# Patient Record
Sex: Male | Born: 1965 | Race: White | Hispanic: No | Marital: Married | Smoking: Former smoker
Health system: Southern US, Community
[De-identification: ages and names within clinical notes are randomized; demographics above are authoritative.]

## PROBLEM LIST (undated history)

## (undated) DIAGNOSIS — G4730 Sleep apnea, unspecified: Secondary | ICD-10-CM

## (undated) DIAGNOSIS — N200 Calculus of kidney: Secondary | ICD-10-CM

## (undated) HISTORY — PX: VASECTOMY: SHX75

## (undated) HISTORY — PX: TONSILLECTOMY: SUR1361

## (undated) HISTORY — DX: Sleep apnea, unspecified: G47.30

---

## 2005-04-22 ENCOUNTER — Encounter (INDEPENDENT_AMBULATORY_CARE_PROVIDER_SITE_OTHER): Payer: Self-pay | Admitting: Internal Medicine

## 2005-04-22 LAB — CONVERTED CEMR LAB: PSA: NEGATIVE ng/mL

## 2005-09-14 ENCOUNTER — Ambulatory Visit: Payer: Self-pay | Admitting: Orthopedic Surgery

## 2005-10-08 ENCOUNTER — Ambulatory Visit: Payer: Self-pay | Admitting: Orthopedic Surgery

## 2006-03-29 ENCOUNTER — Ambulatory Visit: Payer: Self-pay | Admitting: Internal Medicine

## 2006-03-29 LAB — CONVERTED CEMR LAB
AST: 21 units/L
Alkaline Phosphatase: 66 units/L
Basophils Relative: 0 %
CO2: 23 meq/L
Chloride: 103 meq/L
Creatinine, Ser: 0.97 mg/dL
Eosinophils Relative: 1 %
Glucose, Bld: 94 mg/dL
Hemoglobin: 15.2 g/dL
Lymphocytes Relative: 42 %
MCHC: 34.5 g/dL
Monocytes Relative: 6 %
Neutrophils Relative %: 51 %
Platelets: 216 10*3/uL
TSH: 2.285 microintl units/mL
Total Bilirubin: 0.4 mg/dL
Total Protein: 7.6 g/dL
WBC: 8.9 10*3/uL

## 2006-03-30 ENCOUNTER — Encounter (INDEPENDENT_AMBULATORY_CARE_PROVIDER_SITE_OTHER): Payer: Self-pay | Admitting: Internal Medicine

## 2006-03-30 LAB — CONVERTED CEMR LAB
RBC count: 5.02 10*6/uL
WBC, blood: 8.9 10*3/uL

## 2006-04-16 ENCOUNTER — Ambulatory Visit: Admission: RE | Admit: 2006-04-16 | Discharge: 2006-04-16 | Payer: Self-pay | Admitting: Internal Medicine

## 2006-04-21 ENCOUNTER — Ambulatory Visit: Payer: Self-pay | Admitting: Pulmonary Disease

## 2006-05-11 ENCOUNTER — Encounter: Payer: Self-pay | Admitting: Internal Medicine

## 2006-05-11 ENCOUNTER — Encounter (INDEPENDENT_AMBULATORY_CARE_PROVIDER_SITE_OTHER): Payer: Self-pay | Admitting: Internal Medicine

## 2006-05-11 DIAGNOSIS — E291 Testicular hypofunction: Secondary | ICD-10-CM

## 2006-05-11 DIAGNOSIS — G4733 Obstructive sleep apnea (adult) (pediatric): Secondary | ICD-10-CM | POA: Insufficient documentation

## 2006-05-11 DIAGNOSIS — F528 Other sexual dysfunction not due to a substance or known physiological condition: Secondary | ICD-10-CM | POA: Insufficient documentation

## 2006-05-11 DIAGNOSIS — I10 Essential (primary) hypertension: Secondary | ICD-10-CM | POA: Insufficient documentation

## 2006-05-11 HISTORY — DX: Testicular hypofunction: E29.1

## 2006-05-11 HISTORY — DX: Other sexual dysfunction not due to a substance or known physiological condition: F52.8

## 2006-06-21 ENCOUNTER — Ambulatory Visit: Payer: Self-pay | Admitting: Internal Medicine

## 2006-06-21 ENCOUNTER — Ambulatory Visit (HOSPITAL_COMMUNITY): Admission: RE | Admit: 2006-06-21 | Discharge: 2006-06-21 | Payer: Self-pay | Admitting: Internal Medicine

## 2006-07-06 ENCOUNTER — Ambulatory Visit: Payer: Self-pay | Admitting: Internal Medicine

## 2006-12-28 ENCOUNTER — Encounter (INDEPENDENT_AMBULATORY_CARE_PROVIDER_SITE_OTHER): Payer: Self-pay | Admitting: Internal Medicine

## 2006-12-31 ENCOUNTER — Telehealth (INDEPENDENT_AMBULATORY_CARE_PROVIDER_SITE_OTHER): Payer: Self-pay | Admitting: *Deleted

## 2006-12-31 ENCOUNTER — Ambulatory Visit: Payer: Self-pay | Admitting: Internal Medicine

## 2006-12-31 DIAGNOSIS — R5381 Other malaise: Secondary | ICD-10-CM

## 2006-12-31 DIAGNOSIS — R5383 Other fatigue: Secondary | ICD-10-CM

## 2006-12-31 DIAGNOSIS — E785 Hyperlipidemia, unspecified: Secondary | ICD-10-CM | POA: Insufficient documentation

## 2006-12-31 HISTORY — DX: Other malaise: R53.81

## 2007-01-03 ENCOUNTER — Telehealth (INDEPENDENT_AMBULATORY_CARE_PROVIDER_SITE_OTHER): Payer: Self-pay | Admitting: Internal Medicine

## 2007-01-03 ENCOUNTER — Telehealth (INDEPENDENT_AMBULATORY_CARE_PROVIDER_SITE_OTHER): Payer: Self-pay | Admitting: *Deleted

## 2007-01-03 LAB — CONVERTED CEMR LAB
HDL: 28 mg/dL — ABNORMAL LOW (ref >39)
LDL Cholesterol: 105 mg/dL — ABNORMAL HIGH (ref 0–99)
Testosterone: 331.83 ng/dL — ABNORMAL LOW (ref 350–890)
Triglycerides: 240 mg/dL — ABNORMAL HIGH (ref <150)

## 2007-01-10 ENCOUNTER — Ambulatory Visit: Payer: Self-pay | Admitting: Internal Medicine

## 2007-01-20 ENCOUNTER — Encounter (INDEPENDENT_AMBULATORY_CARE_PROVIDER_SITE_OTHER): Payer: Self-pay | Admitting: Internal Medicine

## 2007-02-08 ENCOUNTER — Encounter (INDEPENDENT_AMBULATORY_CARE_PROVIDER_SITE_OTHER): Payer: Self-pay | Admitting: Internal Medicine

## 2007-03-03 ENCOUNTER — Encounter (INDEPENDENT_AMBULATORY_CARE_PROVIDER_SITE_OTHER): Payer: Self-pay | Admitting: Internal Medicine

## 2007-04-06 ENCOUNTER — Telehealth (INDEPENDENT_AMBULATORY_CARE_PROVIDER_SITE_OTHER): Payer: Self-pay | Admitting: Internal Medicine

## 2007-04-06 ENCOUNTER — Encounter (INDEPENDENT_AMBULATORY_CARE_PROVIDER_SITE_OTHER): Payer: Self-pay | Admitting: Internal Medicine

## 2007-11-29 ENCOUNTER — Ambulatory Visit: Payer: Self-pay | Admitting: Internal Medicine

## 2007-11-29 DIAGNOSIS — M79609 Pain in unspecified limb: Secondary | ICD-10-CM

## 2007-11-29 HISTORY — DX: Pain in unspecified limb: M79.609

## 2007-12-01 LAB — CONVERTED CEMR LAB
AST: 22 units/L (ref 0–37)
Basophils Relative: 0 % (ref 0–1)
CO2: 23 meq/L (ref 19–32)
Chloride: 106 meq/L (ref 96–112)
Cholesterol: 169 mg/dL (ref 0–200)
Glucose, Bld: 84 mg/dL (ref 70–99)
HCT: 46.9 % (ref 39.0–52.0)
HDL: 26 mg/dL — ABNORMAL LOW (ref >39)
Lymphs Abs: 3.6 10*3/uL (ref 0.7–4.0)
MCHC: 33.7 g/dL (ref 30.0–36.0)
Platelets: 221 10*3/uL (ref 150–400)
RDW: 13.2 % (ref 11.5–15.5)
Sodium: 139 meq/L (ref 135–145)
Triglycerides: 285 mg/dL — ABNORMAL HIGH (ref <150)
Uric Acid, Serum: 7.7 mg/dL (ref 4.0–7.8)
VLDL: 57 mg/dL — ABNORMAL HIGH (ref 0–40)

## 2007-12-29 ENCOUNTER — Ambulatory Visit: Payer: Self-pay | Admitting: Internal Medicine

## 2008-01-26 ENCOUNTER — Ambulatory Visit: Payer: Self-pay | Admitting: Internal Medicine

## 2008-02-29 ENCOUNTER — Ambulatory Visit: Payer: Self-pay | Admitting: Internal Medicine

## 2008-03-05 ENCOUNTER — Encounter (INDEPENDENT_AMBULATORY_CARE_PROVIDER_SITE_OTHER): Payer: Self-pay | Admitting: Internal Medicine

## 2008-03-05 LAB — CONVERTED CEMR LAB
Alkaline Phosphatase: 61 units/L (ref 39–117)
Basophils Relative: 0 % (ref 0–1)
Eosinophils Absolute: 0.1 10*3/uL (ref 0.0–0.7)
Eosinophils Relative: 1 % (ref 0–5)
HCT: 46 % (ref 39.0–52.0)
Lymphocytes Relative: 51 % — ABNORMAL HIGH (ref 12–46)
Lymphs Abs: 4.7 10*3/uL — ABNORMAL HIGH (ref 0.7–4.0)
Monocytes Absolute: 0.7 10*3/uL (ref 0.1–1.0)
Monocytes Relative: 7 % (ref 3–12)
Platelets: 213 10*3/uL (ref 150–400)
RBC: 5.19 M/uL (ref 4.22–5.81)

## 2008-04-06 ENCOUNTER — Ambulatory Visit: Payer: Self-pay | Admitting: Internal Medicine

## 2008-05-04 ENCOUNTER — Ambulatory Visit: Payer: Self-pay | Admitting: Internal Medicine

## 2008-05-31 ENCOUNTER — Ambulatory Visit: Payer: Self-pay | Admitting: Internal Medicine

## 2008-06-01 ENCOUNTER — Encounter (INDEPENDENT_AMBULATORY_CARE_PROVIDER_SITE_OTHER): Payer: Self-pay | Admitting: Internal Medicine

## 2008-06-04 LAB — CONVERTED CEMR LAB
AST: 22 units/L (ref 0–37)
Alkaline Phosphatase: 59 units/L (ref 39–117)
BUN: 11 mg/dL (ref 6–23)
Basophils Absolute: 0 10*3/uL (ref 0.0–0.1)
CO2: 23 meq/L (ref 19–32)
Chloride: 103 meq/L (ref 96–112)
Eosinophils Relative: 1 % (ref 0–5)
Lymphs Abs: 3 10*3/uL (ref 0.7–4.0)
MCV: 89 fL (ref 78.0–100.0)
Monocytes Absolute: 0.4 10*3/uL (ref 0.1–1.0)
Monocytes Relative: 6 % (ref 3–12)
Neutro Abs: 3.4 10*3/uL (ref 1.7–7.7)
Neutrophils Relative %: 50 % (ref 43–77)
PSA: 1.69 ng/mL (ref 0.10–4.00)
Platelets: 233 10*3/uL (ref 150–400)
Sodium: 138 meq/L (ref 135–145)

## 2008-07-02 ENCOUNTER — Ambulatory Visit: Payer: Self-pay | Admitting: Internal Medicine

## 2008-07-30 ENCOUNTER — Ambulatory Visit: Payer: Self-pay | Admitting: Internal Medicine

## 2008-08-27 ENCOUNTER — Ambulatory Visit: Payer: Self-pay | Admitting: Internal Medicine

## 2008-08-28 ENCOUNTER — Encounter (INDEPENDENT_AMBULATORY_CARE_PROVIDER_SITE_OTHER): Payer: Self-pay | Admitting: Internal Medicine

## 2008-08-29 LAB — CONVERTED CEMR LAB
ALT: 49 units/L (ref 0–53)
Alkaline Phosphatase: 54 units/L (ref 39–117)
Basophils Absolute: 0 10*3/uL (ref 0.0–0.1)
Basophils Relative: 0 % (ref 0–1)
Eosinophils Absolute: 0.1 10*3/uL (ref 0.0–0.7)
Lymphs Abs: 4 10*3/uL (ref 0.7–4.0)
MCV: 87.5 fL (ref 78.0–100.0)
Monocytes Relative: 8 % (ref 3–12)
Neutro Abs: 4.7 10*3/uL (ref 1.7–7.7)
PSA: 0.92 ng/mL (ref 0.10–4.00)
RBC: 5.38 M/uL (ref 4.22–5.81)
RDW: 13.7 % (ref 11.5–15.5)
Total Bilirubin: 0.5 mg/dL (ref 0.3–1.2)
Total Protein: 7.6 g/dL (ref 6.0–8.3)
WBC: 9.5 10*3/uL (ref 4.0–10.5)

## 2008-09-21 ENCOUNTER — Ambulatory Visit: Payer: Self-pay | Admitting: Family Medicine

## 2008-10-22 ENCOUNTER — Ambulatory Visit: Payer: Self-pay | Admitting: Internal Medicine

## 2008-11-16 ENCOUNTER — Ambulatory Visit: Payer: Self-pay | Admitting: Internal Medicine

## 2008-12-14 ENCOUNTER — Ambulatory Visit: Payer: Self-pay | Admitting: Internal Medicine

## 2008-12-14 DIAGNOSIS — R55 Syncope and collapse: Secondary | ICD-10-CM

## 2008-12-14 HISTORY — DX: Syncope and collapse: R55

## 2008-12-18 LAB — CONVERTED CEMR LAB
ALT: 34 units/L (ref 0–53)
AST: 25 units/L (ref 0–37)
Eosinophils Absolute: 0.1 10*3/uL (ref 0.0–0.7)
Eosinophils Relative: 2 % (ref 0–5)
HCT: 47.9 % (ref 39.0–52.0)
Lymphocytes Relative: 42 % (ref 12–46)
Lymphs Abs: 2.8 10*3/uL (ref 0.7–4.0)
MCV: 86.6 fL (ref 78.0–100.0)
Monocytes Absolute: 0.5 10*3/uL (ref 0.1–1.0)
Monocytes Relative: 8 % (ref 3–12)
Neutrophils Relative %: 48 % (ref 43–77)
Platelets: 232 10*3/uL (ref 150–400)
Testosterone: 300.56 ng/dL — ABNORMAL LOW (ref 350–890)
Total Protein: 7.8 g/dL (ref 6.0–8.3)

## 2009-01-11 ENCOUNTER — Ambulatory Visit: Payer: Self-pay | Admitting: Internal Medicine

## 2009-02-08 ENCOUNTER — Ambulatory Visit: Payer: Self-pay | Admitting: Internal Medicine

## 2009-03-08 ENCOUNTER — Ambulatory Visit: Payer: Self-pay | Admitting: Internal Medicine

## 2009-03-11 LAB — CONVERTED CEMR LAB
Albumin: 4.8 g/dL (ref 3.5–5.2)
Basophils Absolute: 0 10*3/uL (ref 0.0–0.1)
Basophils Relative: 0 % (ref 0–1)
Calcium: 10.1 mg/dL (ref 8.4–10.5)
Chloride: 102 meq/L (ref 96–112)
Eosinophils Absolute: 0 10*3/uL (ref 0.0–0.7)
Eosinophils Relative: 0 % (ref 0–5)
Neutrophils Relative %: 59 % (ref 43–77)
Potassium: 4.2 meq/L (ref 3.5–5.3)
RBC: 5.4 M/uL (ref 4.22–5.81)
Testosterone: 234.58 ng/dL — ABNORMAL LOW (ref 350–890)
Total Protein: 8.2 g/dL (ref 6.0–8.3)
WBC: 8.9 10*3/uL (ref 4.0–10.5)

## 2009-03-22 ENCOUNTER — Encounter (INDEPENDENT_AMBULATORY_CARE_PROVIDER_SITE_OTHER): Payer: Self-pay | Admitting: Internal Medicine

## 2009-09-09 ENCOUNTER — Ambulatory Visit (HOSPITAL_COMMUNITY): Admission: RE | Admit: 2009-09-09 | Discharge: 2009-09-09 | Payer: Self-pay | Admitting: Pulmonary Disease

## 2010-11-07 NOTE — Procedures (Signed)
NAME:  Charles Holloway, Charles Holloway NO.:  [id number]   MEDICAL RECORD NO.:  [id number]          PATIENT TYPE:  OUT   LOCATION:  SLEEP LAB                     FACILITY:  [facility]   PHYSICIAN:  Barbaraann Share, MD,FCCPDATE OF BIRTH:  1965-08-09   DATE OF STUDY:  04/16/2006                              NOCTURNAL POLYSOMNOGRAM   REFERRING PHYSICIAN:  Dr. Erle Crocker.   INDICATIONS FOR THE STUDY:  Hypersomnia with sleep apnea.   EPWORTH SCORE:  Is 12.   SLEEP ARCHITECTURE:  The patient had total sleep time of 337 minutes with  adequate slow wave sleep and borderline quantity of REM.  Sleep onset  latency was prolonged at 46 minutes and REM onset was normal.  Sleep  efficiency was decreased at 85%.   RESPIRATORY DATA:  The patient underwent split-night study where he was  found to have 29 obstructive events in the first 135 minutes of sleep.  This  gave him a respiratory disturbance index of 13 events per hour over the  first half of the night.  Events were not positional, but there was very  loud snoring noted throughout.  By protocol, the patient was then placed on  a small/medium ComfortLite Respironics Nasal Mask and ultimately titrated to  a final pressure of 7 cm of water for control of both obstructive events and  snoring.  There was excellent control of the patient's events, even through  REM.   OXYGEN DATA:  There was O2 desaturation as low as 79% with the patient's  obstructive events.   CARDIAC DATA:  No clinically significant cardiac arrhythmias.   MOVEMENT/PARASOMNIA:  None.   IMPRESSION/RECOMMENDATIONS:  Split-night study reveals mild obstructive  sleep apnea/hypopnea syndrome with a respiratory disturbance index of 13  events per hour and O2 desaturation as low as 79%.  The patient was then  placed on CPAP and ultimately titrated to 7 cm of pressure with a  small/medium ComfortLite Respironics Nasal Mask.  Other treatment  considerations for mild obstructive  sleep apnea include weight loss alone if  applicable, upper airway surgery, and oral appliance.  Because the patient  had a split-night study, it is really  unclear whether his severity would have stayed in the mild range throughout  the whole night if CPAP was not added.  Clinical correlation is suggested.                                            ______________________________  Barbaraann Share, MD,FCCP  Diplomate, American Board of Sleep  Medicine     [facility]/MEDQ  D:  04/22/2006 16:44:30  T:  04/23/2006 08:30:04  Job:  [id number]

## 2012-09-19 ENCOUNTER — Other Ambulatory Visit (HOSPITAL_COMMUNITY): Payer: Self-pay | Admitting: Pulmonary Disease

## 2012-09-19 ENCOUNTER — Ambulatory Visit (HOSPITAL_COMMUNITY)
Admission: RE | Admit: 2012-09-19 | Discharge: 2012-09-19 | Disposition: A | Discharge status: Home / Self Care | Payer: 59 | Source: Ambulatory Visit | Attending: Pulmonary Disease | Admitting: Pulmonary Disease

## 2012-09-19 DIAGNOSIS — X58XXXA Exposure to other specified factors, initial encounter: Secondary | ICD-10-CM | POA: Insufficient documentation

## 2012-09-19 DIAGNOSIS — M25529 Pain in unspecified elbow: Secondary | ICD-10-CM | POA: Insufficient documentation

## 2012-09-19 DIAGNOSIS — IMO0002 Reserved for concepts with insufficient information to code with codable children: Secondary | ICD-10-CM | POA: Insufficient documentation

## 2012-09-19 DIAGNOSIS — S2239XA Fracture of one rib, unspecified side, initial encounter for closed fracture: Secondary | ICD-10-CM | POA: Insufficient documentation

## 2012-09-19 DIAGNOSIS — R05 Cough: Secondary | ICD-10-CM | POA: Insufficient documentation

## 2012-09-19 DIAGNOSIS — R059 Cough, unspecified: Secondary | ICD-10-CM | POA: Insufficient documentation

## 2012-09-19 DIAGNOSIS — R0781 Pleurodynia: Secondary | ICD-10-CM

## 2012-09-19 DIAGNOSIS — R079 Chest pain, unspecified: Secondary | ICD-10-CM | POA: Insufficient documentation

## 2012-09-19 DIAGNOSIS — W19XXXA Unspecified fall, initial encounter: Secondary | ICD-10-CM | POA: Insufficient documentation

## 2013-10-12 ENCOUNTER — Other Ambulatory Visit (HOSPITAL_COMMUNITY): Payer: Self-pay

## 2013-10-12 DIAGNOSIS — G4730 Sleep apnea, unspecified: Secondary | ICD-10-CM

## 2013-10-27 ENCOUNTER — Ambulatory Visit: Payer: 59 | Attending: Pulmonary Disease | Admitting: Sleep Medicine

## 2013-10-27 DIAGNOSIS — G4730 Sleep apnea, unspecified: Secondary | ICD-10-CM

## 2013-10-27 DIAGNOSIS — G4733 Obstructive sleep apnea (adult) (pediatric): Secondary | ICD-10-CM | POA: Insufficient documentation

## 2013-11-03 NOTE — Sleep Study (Signed)
  [facility] Aryka Coonradt A. Gerilyn Pilgrimoonquah, MD     [email or web address]        NOCTURNAL POLYSOMNOGRAM    LOCATION: SLEEP LAB FACILITY:    PHYSICIAN: Dreana Britz A. Gerilyn Pilgrimoonquah, M.D.   DATE OF STUDY: 10/27/2013.   REFERRING PHYSICIAN: Kari BaarsEdward Hawkins.  INDICATIONS: This is a 48 year old man who a previous study documenting obstructive sleep apnea syndrome. The a CPAP titration recording.  MEDICATIONS:  Prior to Admission medications   Not on File      EPWORTH SLEEPINESS SCALE: 12.   BMI: 32.   ARCHITECTURAL SUMMARY: Total recording time was 406 minutes. Sleep efficiency 95 %. Sleep latency 12 minutes. REM latency 80 minutes. Stage NI 3 %, N2 40 % and N3 29 % and REM sleep 27 %.    RESPIRATORY DATA:  Baseline oxygen saturation is 95 %. The lowest saturation is 88 %. The patient was placed on positive pressure starting at 5 and increased to 8. The optimal pressure is 7 with resolution of obstructive events and good tolerance.  LIMB MOVEMENT SUMMARY: PLM index 0.   ELECTROCARDIOGRAM SUMMARY: Average heart rate is 64 with no significant dysrhythmias observed.   IMPRESSION:  1. Obstructive sleep apnea syndrome which responds well to a CPAP of 7.  Thanks for this referral.  Katalena Malveaux A. Gerilyn Pilgrimoonquah, M.D. Diplomat, Biomedical engineerAmerican Board of Sleep Medicine.

## 2016-04-01 ENCOUNTER — Other Ambulatory Visit (HOSPITAL_COMMUNITY): Payer: Self-pay | Admitting: Pulmonary Disease

## 2016-04-01 ENCOUNTER — Ambulatory Visit (HOSPITAL_COMMUNITY)
Admission: RE | Admit: 2016-04-01 | Discharge: 2016-04-01 | Disposition: A | Discharge status: Home / Self Care | Payer: 59 | Source: Ambulatory Visit | Attending: Pulmonary Disease | Admitting: Pulmonary Disease

## 2016-04-01 DIAGNOSIS — N200 Calculus of kidney: Secondary | ICD-10-CM | POA: Insufficient documentation

## 2016-04-01 DIAGNOSIS — M545 Low back pain: Secondary | ICD-10-CM

## 2016-04-01 DIAGNOSIS — N50819 Testicular pain, unspecified: Secondary | ICD-10-CM | POA: Diagnosis present

## 2016-04-01 DIAGNOSIS — M2578 Osteophyte, vertebrae: Secondary | ICD-10-CM | POA: Insufficient documentation

## 2016-06-26 ENCOUNTER — Encounter (HOSPITAL_COMMUNITY): Payer: Self-pay

## 2016-06-26 ENCOUNTER — Emergency Department (HOSPITAL_COMMUNITY): Payer: 59

## 2016-06-26 ENCOUNTER — Emergency Department (HOSPITAL_COMMUNITY)
Admission: EM | Admit: 2016-06-26 | Discharge: 2016-06-27 | Disposition: A | Discharge status: Home / Self Care | Payer: 59 | Attending: Emergency Medicine | Admitting: Emergency Medicine

## 2016-06-26 DIAGNOSIS — N200 Calculus of kidney: Secondary | ICD-10-CM | POA: Diagnosis not present

## 2016-06-26 DIAGNOSIS — I10 Essential (primary) hypertension: Secondary | ICD-10-CM | POA: Diagnosis not present

## 2016-06-26 DIAGNOSIS — R109 Unspecified abdominal pain: Secondary | ICD-10-CM | POA: Diagnosis not present

## 2016-06-26 DIAGNOSIS — N132 Hydronephrosis with renal and ureteral calculous obstruction: Secondary | ICD-10-CM | POA: Diagnosis not present

## 2016-06-26 HISTORY — DX: Calculus of kidney: N20.0

## 2016-06-26 LAB — BASIC METABOLIC PANEL
ANION GAP: 12 (ref 5–15)
BUN: 18 mg/dL (ref 6–20)
CO2: 25 mmol/L (ref 22–32)
Calcium: 9.7 mg/dL (ref 8.9–10.3)
Chloride: 100 mmol/L — ABNORMAL LOW (ref 101–111)
Creatinine, Ser: 1.3 mg/dL — ABNORMAL HIGH (ref 0.61–1.24)
GFR calc Af Amer: >60 mL/min (ref >60)
GLUCOSE: 135 mg/dL — AB (ref 65–99)
POTASSIUM: 3.5 mmol/L (ref 3.5–5.1)
SODIUM: 137 mmol/L (ref 135–145)

## 2016-06-26 LAB — CBC
HCT: 46.2 % (ref 39.0–52.0)
HEMOGLOBIN: 16.2 g/dL (ref 13.0–17.0)
MCH: 31.3 pg (ref 26.0–34.0)
MCHC: 35.1 g/dL (ref 30.0–36.0)
MCV: 89.2 fL (ref 78.0–100.0)
PLATELETS: 213 10*3/uL (ref 150–400)
RBC: 5.18 MIL/uL (ref 4.22–5.81)
RDW: 12.7 % (ref 11.5–15.5)
WBC: 15.1 10*3/uL — AB (ref 4.0–10.5)

## 2016-06-26 MED ORDER — ONDANSETRON HCL 4 MG/2ML IJ SOLN
4.0000 mg | Freq: Once | INTRAMUSCULAR | Status: AC
  Administered 2016-06-26: 4 mg via INTRAVENOUS
  Filled 2016-06-26: qty 2

## 2016-06-26 MED ORDER — FENTANYL CITRATE (PF) 100 MCG/2ML IJ SOLN
100.0000 ug | Freq: Once | INTRAMUSCULAR | Status: AC
  Administered 2016-06-26: 100 ug via INTRAVENOUS

## 2016-06-26 MED ORDER — HYDROMORPHONE HCL 1 MG/ML IJ SOLN
1.0000 mg | Freq: Once | INTRAMUSCULAR | Status: AC
  Administered 2016-06-26: 1 mg via INTRAVENOUS
  Filled 2016-06-26: qty 1

## 2016-06-26 MED ORDER — FENTANYL CITRATE (PF) 100 MCG/2ML IJ SOLN
50.0000 ug | INTRAMUSCULAR | Status: DC | PRN
  Filled 2016-06-26: qty 2

## 2016-06-26 NOTE — ED Triage Notes (Signed)
Patient states that he started having pain last night that seemed to be a kidney stone.  Then the pain went away so I thought it was in the bladder.  States that he started having the pain again tonight with pain going into left testicle and when we got home I started having pain again in left flank.  Have been vomiting and having nausea.

## 2016-06-26 NOTE — ED Provider Notes (Signed)
[facility]-EMERGENCY DEPT Provider Note   CSN: [id number] Arrival date & time: 06/26/16  2121     History   Chief Complaint Chief Complaint  Patient presents with  . Nephrolithiasis    HPI Charles Holloway is a 51 y.o. male.  HPI   Charles DessertWilliam D Holloway is a 51 y.o. male who presents to the Emergency Department complaining of sudden onset of left flank pain last evening.  He describes an intense pain that radiated toward his left groin and was associated with vomiting.  Pain resolved during the night and pain was improved today.  This evening, he states the pain returned and now radiates to the left testicle.  Pain is again associated with nausea and vomiting.  He reports hx of kidney stones and pain feels similar to previous.  He also endorses urinary hesitancy today.  He denies fever, hematuria, abdominal pain, LE's pain.  Has seen Dr. Vernie Ammonsttelin with [facility] in the past.    Past Medical History:  Diagnosis Date  . Kidney stones     Patient Active Problem List   Diagnosis Date Noted  . VASOVAGAL SYNCOPE 12/14/2008  . TOE PAIN 11/29/2007  . HYPERLIPIDEMIA 12/31/2006  . FATIGUE 12/31/2006  . HYPOGONADISM 05/11/2006  . ERECTILE DYSFUNCTION 05/11/2006  . OBSTRUCTIVE SLEEP APNEA 05/11/2006  . HYPERTENSION 05/11/2006    Past Surgical History:  Procedure Laterality Date  . TONSILLECTOMY    . VASECTOMY         Home Medications    Prior to Admission medications   Not on File    Family History No family history on file.  Social History Social History  Substance Use Topics  . Smoking status: Never Smoker  . Smokeless tobacco: Never Used  . Alcohol use Yes     Allergies   Penicillins   Review of Systems Review of Systems  Constitutional: Negative for appetite change, chills and fever.  Respiratory: Negative for shortness of breath.   Cardiovascular: Negative for chest pain.  Gastrointestinal: Positive for nausea and vomiting. Negative for abdominal pain and  blood in stool.  Genitourinary: Positive for difficulty urinating, flank pain and testicular pain. Negative for decreased urine volume, dysuria, hematuria, penile swelling and scrotal swelling.  Musculoskeletal: Negative for back pain.  Skin: Negative for color change and rash.  Neurological: Negative for dizziness, weakness and numbness.  Hematological: Negative for adenopathy.  All other systems reviewed and are negative.    Physical Exam Updated Vital Signs BP 156/90   Pulse 73   Temp 97.8 F (36.6 C) (Oral)   Resp 20   Ht 5\' 11"  (1.803 m)   Wt 99.8 kg   SpO2 96%   BMI 30.68 kg/m   Physical Exam  Constitutional: He is oriented to person, place, and time. He appears well-developed and well-nourished.  Pt is uncomfortable appearing  HENT:  Head: Normocephalic and atraumatic.  Mouth/Throat: Oropharynx is clear and moist.  Cardiovascular: Normal rate, regular rhythm and intact distal pulses.   No murmur heard. Pulmonary/Chest: Effort normal and breath sounds normal. No respiratory distress. He exhibits no tenderness.  Abdominal: Soft. Bowel sounds are normal. He exhibits no distension and no mass. There is no tenderness. There is no rebound, no guarding and no CVA tenderness.  Pt points to left flank and groin as area of pain.  No CVA tenderness on exam.  Abdomen is soft. NT  Musculoskeletal: Normal range of motion. He exhibits no edema.  Neurological: He is alert and oriented to  person, place, and time. He exhibits normal muscle tone. Coordination normal.  Skin: Skin is warm and dry.  Nursing note and vitals reviewed.    ED Treatments / Results  Labs (all labs ordered are listed, but only abnormal results are displayed) Labs Reviewed  URINALYSIS, ROUTINE W REFLEX MICROSCOPIC - Abnormal; Notable for the following:       Result Value   Color, Urine AMBER (*)    APPearance HAZY (*)    Hgb urine dipstick LARGE (*)    Ketones, ur 20 (*)    Protein, ur 100 (*)    All  other components within normal limits  CBC - Abnormal; Notable for the following:    WBC 15.1 (*)    All other components within normal limits  BASIC METABOLIC PANEL - Abnormal; Notable for the following:    Chloride 100 (*)    Glucose, Bld 135 (*)    Creatinine, Ser 1.30 (*)    All other components within normal limits  URINE CULTURE    EKG  EKG Interpretation None       Radiology Ct Renal Stone Study  Result Date: 06/26/2016 CLINICAL DATA:  Left side sharp flank pain vomiting and nausea EXAM: CT ABDOMEN AND PELVIS WITHOUT CONTRAST TECHNIQUE: Multidetector CT imaging of the abdomen and pelvis was performed following the standard protocol without IV contrast. COMPARISON:  Radiograph 03/22/2016 FINDINGS: Lower chest: Lung bases demonstrate no acute consolidation or pleural effusion. Heart size nonenlarged. Hepatobiliary: Fatty infiltration of the liver with focal sparing near the gallbladder fossa. No calcified stones. No focal hepatic abnormality Pancreas: Unremarkable. No pancreatic ductal dilatation or surrounding inflammatory changes. Spleen: Normal in size without focal abnormality. Adrenals/Urinary Tract: Adrenal glands are within normal limits. Mild nonspecific perinephric fat stranding on the right. Moderate perinephric fat stranding on the left. Moderate hydronephrosis and hydroureter on the left, secondary to a 6 mm transaxial by 9 mm craniocaudad stone in the mid left ureter. Bladder is normal. No calcified stones on the right. Stomach/Bowel: Stomach is within normal limits. Appendix appears normal. No evidence of bowel wall thickening, distention, or inflammatory changes. Sigmoid diverticula without acute inflammation Vascular/Lymphatic: Aortic atherosclerosis. No enlarged abdominal or pelvic lymph nodes. Reproductive: Prostate is unremarkable. Other: Small fat filled inguinal hernias. No free air or free fluid. Small fatty umbilical hernia Musculoskeletal: No acute or suspicious  osseous abnormality. IMPRESSION: Moderate left hydronephrosis and hydroureter secondary to a 6 x 9 mm stone in the mid left ureter. Fatty infiltration of the liver with focal sparing at the gallbladder fossa Electronically Signed   By: Jasmine Pang M.D.   On: 06/26/2016 23:19    Procedures Procedures (including critical care time)  Medications Ordered in ED Medications  oxyCODONE-acetaminophen (PERCOCET/ROXICET) 5-325 MG per tablet 1 tablet (not administered)  ondansetron (ZOFRAN) injection 4 mg (4 mg Intravenous Given 06/26/16 2234)  fentaNYL (SUBLIMAZE) injection 100 mcg (100 mcg Intravenous Given 06/26/16 2234)  HYDROmorphone (DILAUDID) injection 1 mg (1 mg Intravenous Given 06/26/16 2312)     Initial Impression / Assessment and Plan / ED Course  I have reviewed the triage vital signs and the nursing notes.  Pertinent labs & imaging results that were available during my care of the patient were reviewed by me and considered in my medical decision making (see chart for details).  Clinical Course    2240 Pt feeling better after Fentanyl, resting comfortably.  Will obtain urine and CT renal study  0045  CT results reviewed and discussed with  patient.  Elevated WBC's likely pain response.  Pt is well appearing, non-toxic.  Doubt infected stone.  He is resting comfortably, but pain is increasing despite medications.  Will try oral percocet.  Given size of the stone, I will consult urology   [phone]  Consulted Dr. Sherryl Barters.  Recommended pain control with Toradol and f/u with the office on Monday.     Final Clinical Impressions(s) / ED Diagnoses   Final diagnoses:  Kidney stone    New Prescriptions New Prescriptions   No medications on file     Pauline Aus, Cordelia Poche 06/27/16 0111    Bethann Berkshire, MD 06/27/16 1558

## 2016-06-27 LAB — URINALYSIS, ROUTINE W REFLEX MICROSCOPIC
Bacteria, UA: NONE SEEN
Bilirubin Urine: NEGATIVE
Glucose, UA: NEGATIVE mg/dL
KETONES UR: 20 mg/dL — AB
Leukocytes, UA: NEGATIVE
Nitrite: NEGATIVE
PROTEIN: 100 mg/dL — AB
Specific Gravity, Urine: 1.028 (ref 1.005–1.030)
pH: 6 (ref 5.0–8.0)

## 2016-06-27 MED ORDER — TAMSULOSIN HCL 0.4 MG PO CAPS
0.4000 mg | ORAL_CAPSULE | Freq: Every day | ORAL | 0 refills | Status: DC
Start: 2016-06-27 — End: 2020-01-12

## 2016-06-27 MED ORDER — OXYCODONE-ACETAMINOPHEN 5-325 MG PO TABS: 1.0000 | ORAL_TABLET | ORAL | 0 refills | Status: DC | PRN

## 2016-06-27 MED ORDER — ONDANSETRON HCL 4 MG PO TABS: 4.0000 mg | ORAL_TABLET | Freq: Four times a day (QID) | ORAL | 0 refills | Status: DC

## 2016-06-27 MED ORDER — KETOROLAC TROMETHAMINE 30 MG/ML IJ SOLN
INTRAMUSCULAR | Status: AC
  Filled 2016-06-27: qty 1

## 2016-06-27 MED ORDER — OXYCODONE-ACETAMINOPHEN 5-325 MG PO TABS
1.0000 | ORAL_TABLET | Freq: Once | ORAL | Status: AC
  Administered 2016-06-27: 1 via ORAL
  Filled 2016-06-27: qty 1

## 2016-06-27 MED ORDER — KETOROLAC TROMETHAMINE 30 MG/ML IJ SOLN
30.0000 mg | Freq: Once | INTRAMUSCULAR | Status: AC
  Administered 2016-06-27: 30 mg via INTRAVENOUS

## 2016-06-27 NOTE — Discharge Instructions (Signed)
Call Dr. Margrett Rudttelin's office on Monday.  Return to ER for any worsening symtpoms

## 2016-06-28 LAB — URINE CULTURE: Culture: NO GROWTH

## 2016-06-29 DIAGNOSIS — N201 Calculus of ureter: Secondary | ICD-10-CM | POA: Diagnosis not present

## 2016-06-29 DIAGNOSIS — N134 Hydroureter: Secondary | ICD-10-CM | POA: Diagnosis not present

## 2016-07-01 DIAGNOSIS — M9901 Segmental and somatic dysfunction of cervical region: Secondary | ICD-10-CM | POA: Diagnosis not present

## 2016-07-01 DIAGNOSIS — M5033 Other cervical disc degeneration, cervicothoracic region: Secondary | ICD-10-CM | POA: Diagnosis not present

## 2016-07-01 MED FILL — Oxycodone w/ Acetaminophen Tab 5-325 MG: ORAL | Qty: 6 | Status: AC

## 2016-07-13 DIAGNOSIS — M5033 Other cervical disc degeneration, cervicothoracic region: Secondary | ICD-10-CM | POA: Diagnosis not present

## 2016-07-13 DIAGNOSIS — M9901 Segmental and somatic dysfunction of cervical region: Secondary | ICD-10-CM | POA: Diagnosis not present

## 2016-07-14 DIAGNOSIS — N201 Calculus of ureter: Secondary | ICD-10-CM | POA: Diagnosis not present

## 2016-07-15 ENCOUNTER — Ambulatory Visit (HOSPITAL_COMMUNITY)
Admission: RE | Admit: 2016-07-15 | Discharge: 2016-07-15 | Disposition: A | Discharge status: Home / Self Care | Payer: 59 | Source: Ambulatory Visit | Attending: Pulmonary Disease | Admitting: Pulmonary Disease

## 2016-07-15 ENCOUNTER — Other Ambulatory Visit (HOSPITAL_COMMUNITY): Payer: Self-pay | Admitting: Pulmonary Disease

## 2016-07-15 DIAGNOSIS — R05 Cough: Secondary | ICD-10-CM

## 2016-07-15 DIAGNOSIS — R059 Cough, unspecified: Secondary | ICD-10-CM

## 2016-08-03 DIAGNOSIS — M5033 Other cervical disc degeneration, cervicothoracic region: Secondary | ICD-10-CM | POA: Diagnosis not present

## 2016-08-03 DIAGNOSIS — M9901 Segmental and somatic dysfunction of cervical region: Secondary | ICD-10-CM | POA: Diagnosis not present

## 2016-08-04 DIAGNOSIS — N201 Calculus of ureter: Secondary | ICD-10-CM | POA: Diagnosis not present

## 2016-08-13 DIAGNOSIS — M9901 Segmental and somatic dysfunction of cervical region: Secondary | ICD-10-CM | POA: Diagnosis not present

## 2016-08-13 DIAGNOSIS — M5033 Other cervical disc degeneration, cervicothoracic region: Secondary | ICD-10-CM | POA: Diagnosis not present

## 2016-08-19 DIAGNOSIS — M9901 Segmental and somatic dysfunction of cervical region: Secondary | ICD-10-CM | POA: Diagnosis not present

## 2016-08-19 DIAGNOSIS — M5033 Other cervical disc degeneration, cervicothoracic region: Secondary | ICD-10-CM | POA: Diagnosis not present

## 2016-08-20 DIAGNOSIS — M9901 Segmental and somatic dysfunction of cervical region: Secondary | ICD-10-CM | POA: Diagnosis not present

## 2016-08-20 DIAGNOSIS — M5033 Other cervical disc degeneration, cervicothoracic region: Secondary | ICD-10-CM | POA: Diagnosis not present

## 2016-09-07 DIAGNOSIS — M9901 Segmental and somatic dysfunction of cervical region: Secondary | ICD-10-CM | POA: Diagnosis not present

## 2016-09-07 DIAGNOSIS — M5033 Other cervical disc degeneration, cervicothoracic region: Secondary | ICD-10-CM | POA: Diagnosis not present

## 2016-09-10 DIAGNOSIS — M9901 Segmental and somatic dysfunction of cervical region: Secondary | ICD-10-CM | POA: Diagnosis not present

## 2016-09-10 DIAGNOSIS — M5033 Other cervical disc degeneration, cervicothoracic region: Secondary | ICD-10-CM | POA: Diagnosis not present

## 2016-09-10 DIAGNOSIS — N201 Calculus of ureter: Secondary | ICD-10-CM | POA: Diagnosis not present

## 2016-09-14 DIAGNOSIS — M9901 Segmental and somatic dysfunction of cervical region: Secondary | ICD-10-CM | POA: Diagnosis not present

## 2016-09-14 DIAGNOSIS — M5033 Other cervical disc degeneration, cervicothoracic region: Secondary | ICD-10-CM | POA: Diagnosis not present

## 2016-09-21 DIAGNOSIS — M9901 Segmental and somatic dysfunction of cervical region: Secondary | ICD-10-CM | POA: Diagnosis not present

## 2016-09-21 DIAGNOSIS — M5033 Other cervical disc degeneration, cervicothoracic region: Secondary | ICD-10-CM | POA: Diagnosis not present

## 2016-09-24 DIAGNOSIS — M9901 Segmental and somatic dysfunction of cervical region: Secondary | ICD-10-CM | POA: Diagnosis not present

## 2016-09-24 DIAGNOSIS — M5033 Other cervical disc degeneration, cervicothoracic region: Secondary | ICD-10-CM | POA: Diagnosis not present

## 2016-10-01 DIAGNOSIS — M9901 Segmental and somatic dysfunction of cervical region: Secondary | ICD-10-CM | POA: Diagnosis not present

## 2016-10-01 DIAGNOSIS — M5033 Other cervical disc degeneration, cervicothoracic region: Secondary | ICD-10-CM | POA: Diagnosis not present

## 2016-10-12 DIAGNOSIS — M5033 Other cervical disc degeneration, cervicothoracic region: Secondary | ICD-10-CM | POA: Diagnosis not present

## 2016-10-12 DIAGNOSIS — M9901 Segmental and somatic dysfunction of cervical region: Secondary | ICD-10-CM | POA: Diagnosis not present

## 2016-10-19 DIAGNOSIS — M9901 Segmental and somatic dysfunction of cervical region: Secondary | ICD-10-CM | POA: Diagnosis not present

## 2016-10-19 DIAGNOSIS — M5033 Other cervical disc degeneration, cervicothoracic region: Secondary | ICD-10-CM | POA: Diagnosis not present

## 2016-10-26 DIAGNOSIS — M5033 Other cervical disc degeneration, cervicothoracic region: Secondary | ICD-10-CM | POA: Diagnosis not present

## 2016-10-26 DIAGNOSIS — M9901 Segmental and somatic dysfunction of cervical region: Secondary | ICD-10-CM | POA: Diagnosis not present

## 2016-11-05 DIAGNOSIS — M9901 Segmental and somatic dysfunction of cervical region: Secondary | ICD-10-CM | POA: Diagnosis not present

## 2016-11-05 DIAGNOSIS — M5033 Other cervical disc degeneration, cervicothoracic region: Secondary | ICD-10-CM | POA: Diagnosis not present

## 2016-11-18 DIAGNOSIS — M9901 Segmental and somatic dysfunction of cervical region: Secondary | ICD-10-CM | POA: Diagnosis not present

## 2016-11-18 DIAGNOSIS — M5033 Other cervical disc degeneration, cervicothoracic region: Secondary | ICD-10-CM | POA: Diagnosis not present

## 2016-11-23 DIAGNOSIS — M5033 Other cervical disc degeneration, cervicothoracic region: Secondary | ICD-10-CM | POA: Diagnosis not present

## 2016-11-23 DIAGNOSIS — M9901 Segmental and somatic dysfunction of cervical region: Secondary | ICD-10-CM | POA: Diagnosis not present

## 2016-11-30 DIAGNOSIS — M9901 Segmental and somatic dysfunction of cervical region: Secondary | ICD-10-CM | POA: Diagnosis not present

## 2016-11-30 DIAGNOSIS — M5033 Other cervical disc degeneration, cervicothoracic region: Secondary | ICD-10-CM | POA: Diagnosis not present

## 2016-12-09 DIAGNOSIS — M5033 Other cervical disc degeneration, cervicothoracic region: Secondary | ICD-10-CM | POA: Diagnosis not present

## 2016-12-09 DIAGNOSIS — M9901 Segmental and somatic dysfunction of cervical region: Secondary | ICD-10-CM | POA: Diagnosis not present

## 2016-12-14 DIAGNOSIS — M5033 Other cervical disc degeneration, cervicothoracic region: Secondary | ICD-10-CM | POA: Diagnosis not present

## 2016-12-14 DIAGNOSIS — M9901 Segmental and somatic dysfunction of cervical region: Secondary | ICD-10-CM | POA: Diagnosis not present

## 2017-05-10 DIAGNOSIS — M5033 Other cervical disc degeneration, cervicothoracic region: Secondary | ICD-10-CM | POA: Diagnosis not present

## 2017-05-10 DIAGNOSIS — M9901 Segmental and somatic dysfunction of cervical region: Secondary | ICD-10-CM | POA: Diagnosis not present

## 2017-05-18 DIAGNOSIS — M5033 Other cervical disc degeneration, cervicothoracic region: Secondary | ICD-10-CM | POA: Diagnosis not present

## 2017-05-18 DIAGNOSIS — M9901 Segmental and somatic dysfunction of cervical region: Secondary | ICD-10-CM | POA: Diagnosis not present

## 2017-07-26 IMAGING — CT CT RENAL STONE PROTOCOL
2 of 4 series · 16 of 46 positions shown, 18 images · non-contrast
Comparison: Radiograph 03/22/2016

CLINICAL DATA: Left side sharp flank pain vomiting and nausea

EXAM:
CT ABDOMEN AND PELVIS WITHOUT CONTRAST
TECHNIQUE: Multidetector CT imaging of the abdomen and pelvis was performed
following the standard protocol without IV contrast.

[Series 2: axial st · axial · 0.79mm/px · z∈[-866,-426]mm · 13 of 96 slices shown, 15 images]
[im 4/96  soft-tissue]
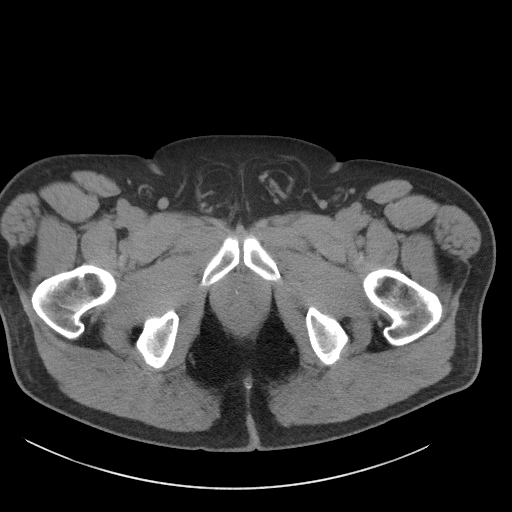
[im 4/96  bone]
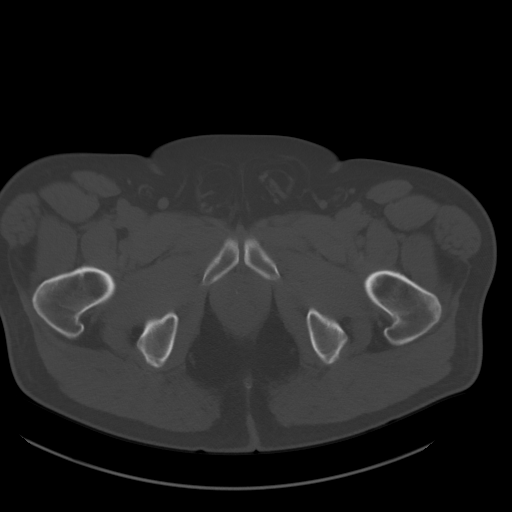
[im 12/96  soft-tissue]
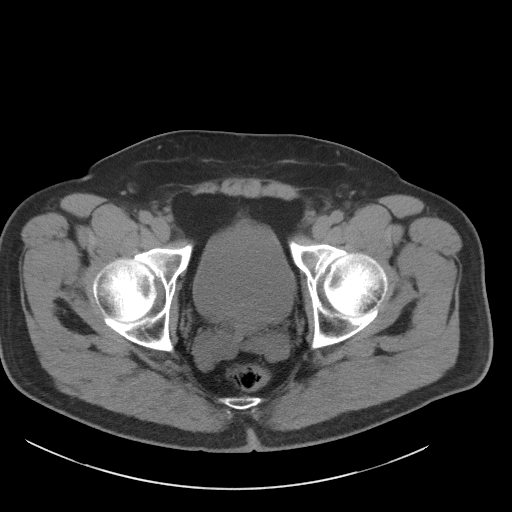
[im 20/96  soft-tissue]
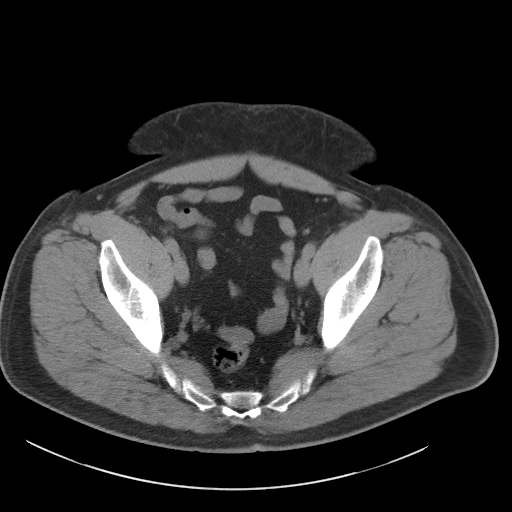
[im 28/96  soft-tissue]
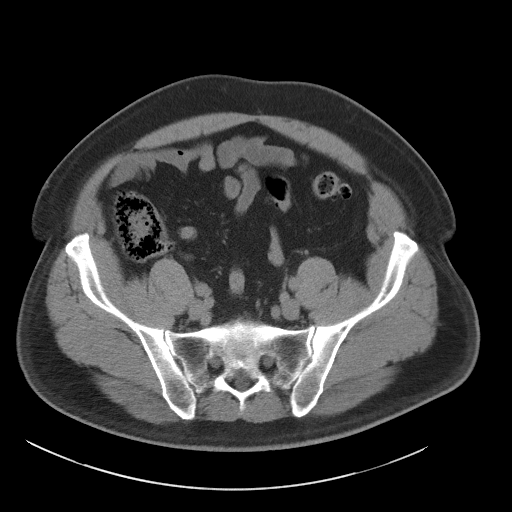
[im 32/96  soft-tissue]
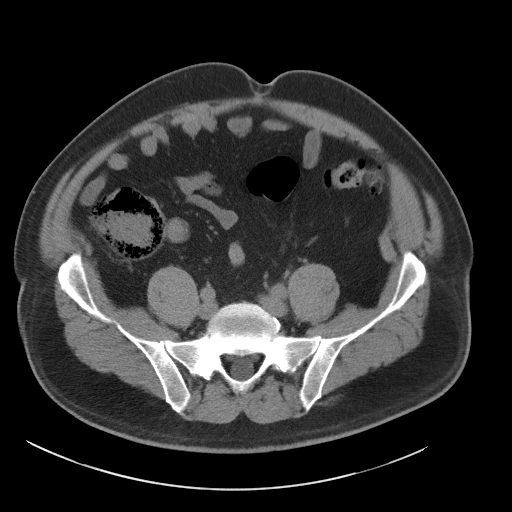
[im 40/96  soft-tissue]
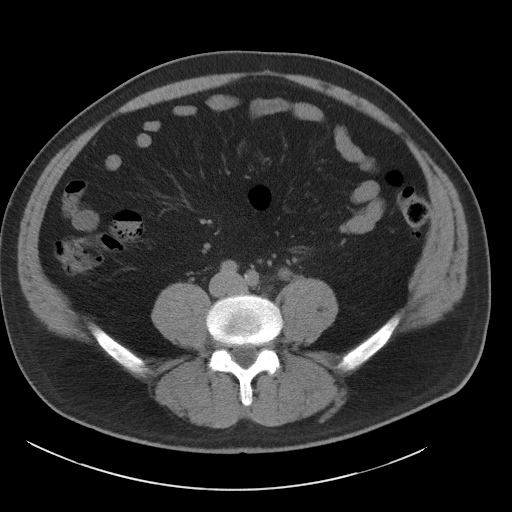
[im 48/96  soft-tissue]
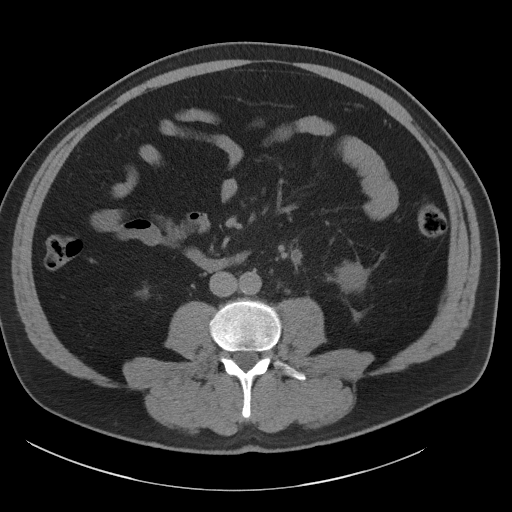
[im 56/96  soft-tissue]
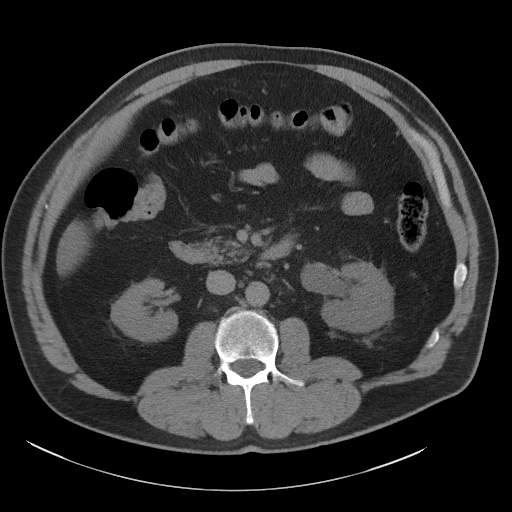
[im 64/96  soft-tissue]
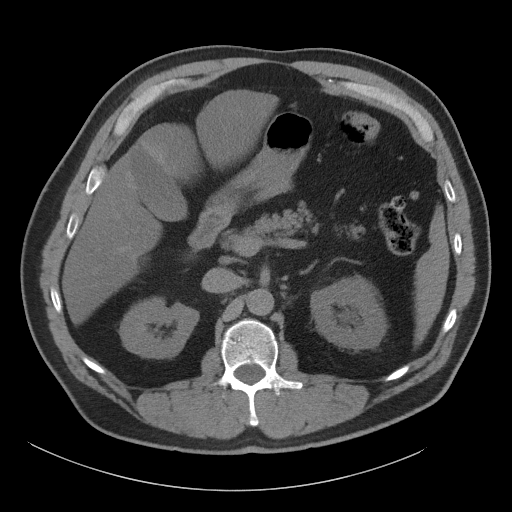
[im 64/96  bone]
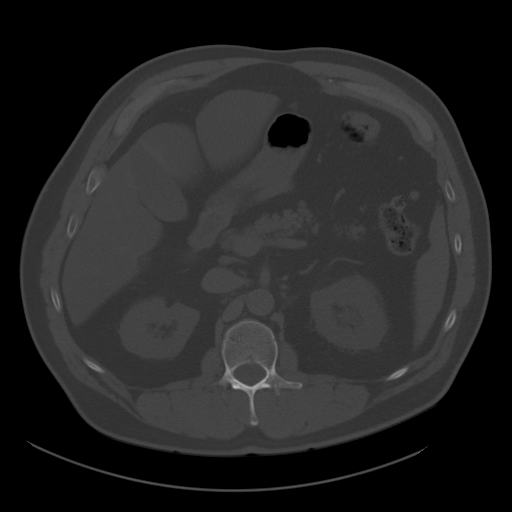
[im 68/96  soft-tissue]
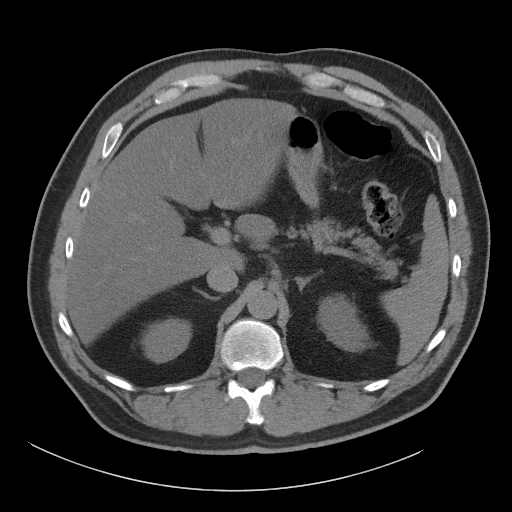
[im 76/96  soft-tissue]
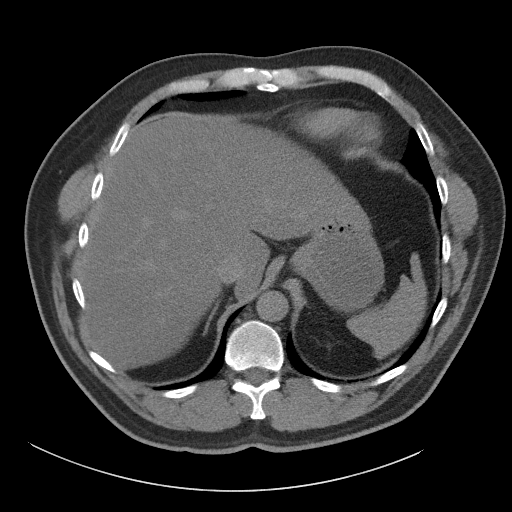
[im 84/96  soft-tissue]
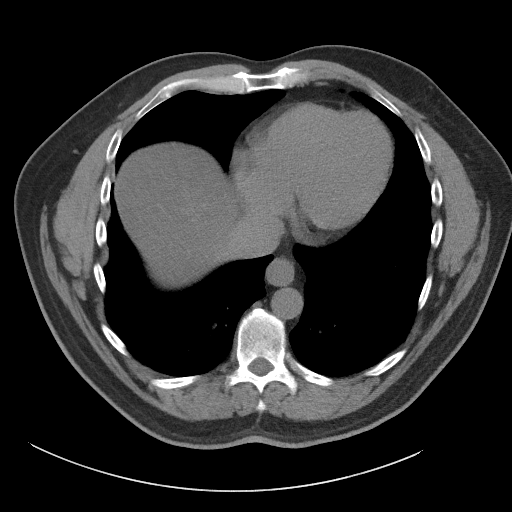
[im 92/96  soft-tissue]
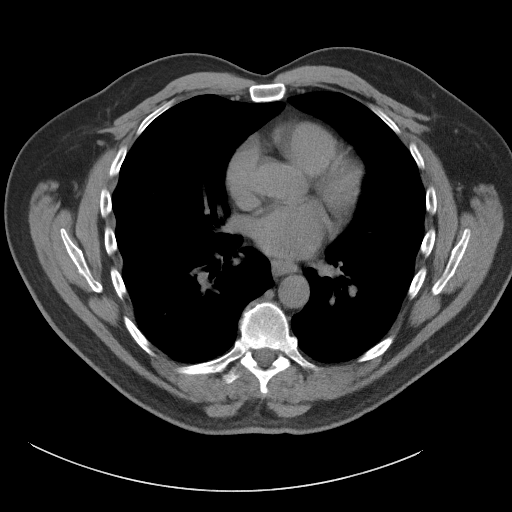

[Series 3: coronal st · coronal · 0.89mm/px · 3 of 131 slices shown]
[im 44/131  soft-tissue]
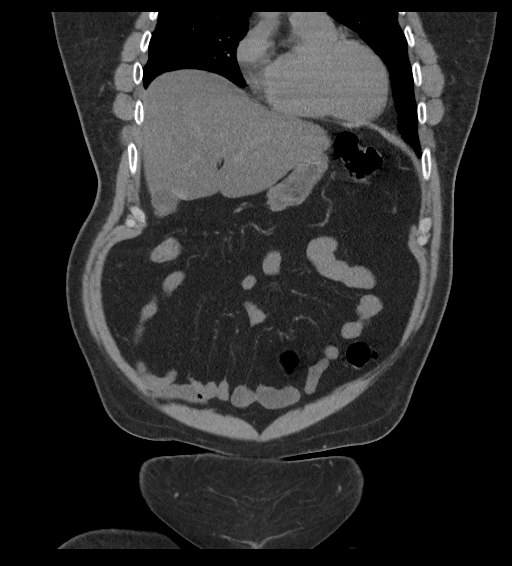
[im 58/131  soft-tissue]
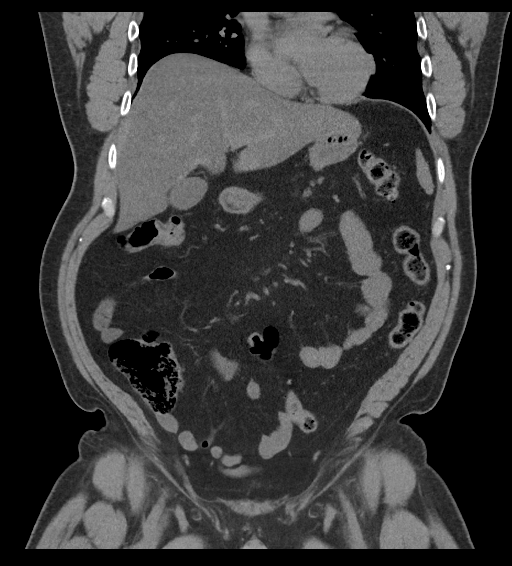
[im 73/131  soft-tissue]
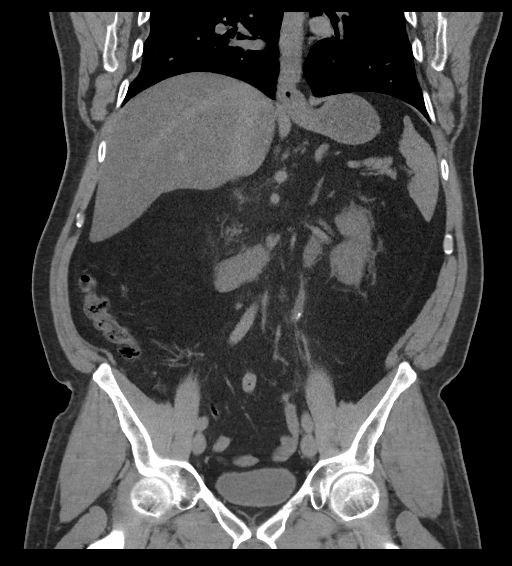

[16 of 46 positions shown; findings below may reference images not displayed]

FINDINGS: Lower chest: Lung bases demonstrate no acute consolidation or
pleural effusion. Heart size nonenlarged.

Hepatobiliary: Fatty infiltration of the liver with focal sparing
near the gallbladder fossa. No calcified stones. No focal hepatic
abnormality

Pancreas: Unremarkable. No pancreatic ductal dilatation or
surrounding inflammatory changes.

Spleen: Normal in size without focal abnormality.

Adrenals/Urinary Tract: Adrenal glands are within normal limits.
Mild nonspecific perinephric fat stranding on the right. Moderate
perinephric fat stranding on the left. Moderate hydronephrosis and
hydroureter on the left, secondary to a 6 mm transaxial by 9 mm
craniocaudad stone in the mid left ureter. Bladder is normal. No
calcified stones on the right.

Stomach/Bowel: Stomach is within normal limits. Appendix appears
normal. No evidence of bowel wall thickening, distention, or
inflammatory changes. Sigmoid diverticula without acute inflammation

Vascular/Lymphatic: Aortic atherosclerosis. No enlarged abdominal or
pelvic lymph nodes.

Reproductive: Prostate is unremarkable.

Other: Small fat filled inguinal hernias. No free air or free fluid.
Small fatty umbilical hernia

Musculoskeletal: No acute or suspicious osseous abnormality.
IMPRESSION: Moderate left hydronephrosis and hydroureter secondary to a 6 x 9 mm
stone in the mid left ureter.

Fatty infiltration of the liver with focal sparing at the
gallbladder fossa

## 2017-08-14 IMAGING — DX DG CHEST 2V
2 series · 2 of 2 positions shown · non-contrast
Comparison: Two-view chest x-ray 09/19/2012

CLINICAL DATA: Nonproductive cough for 3 weeks.

EXAM:
CHEST  2 VIEW

[chest pa]
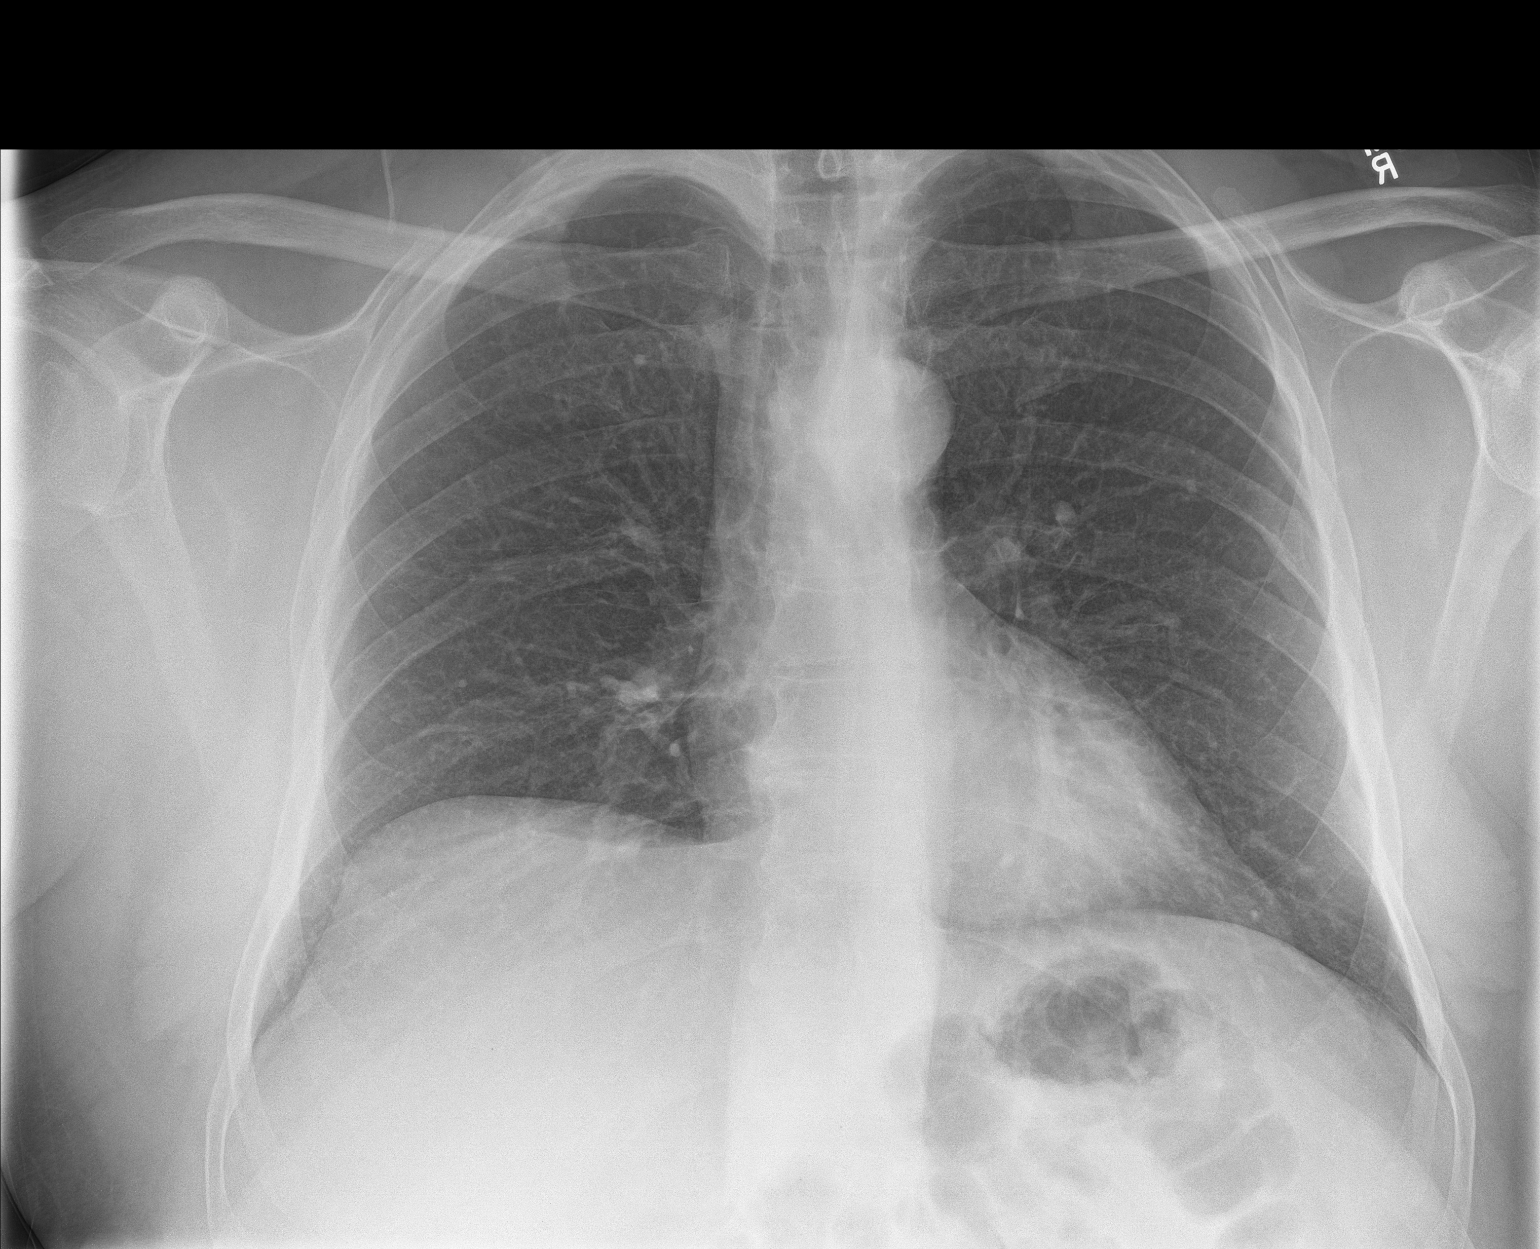

[chest lat]
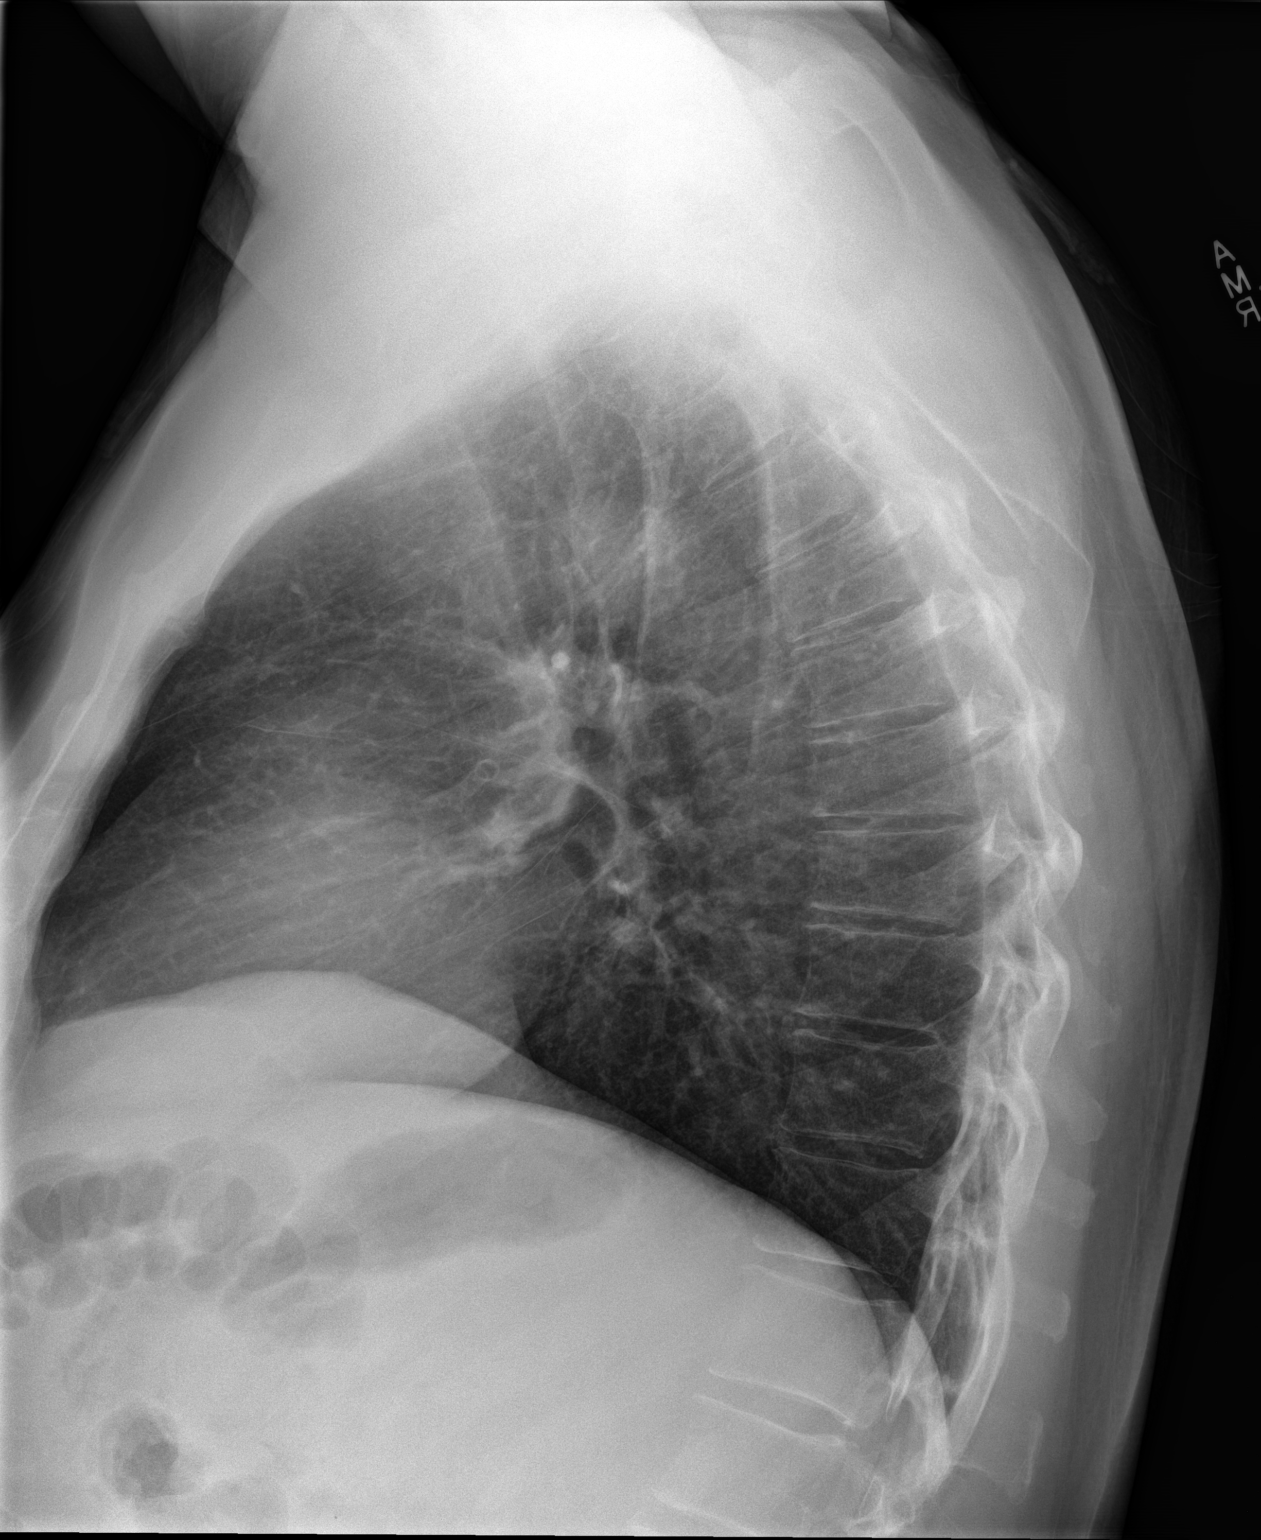

[2 of 2 positions shown; findings below may reference images not displayed]

FINDINGS: Heart size is normal. The lungs are clear. The healed posterolateral
left sixth rib fracture is noted. No acute fractures are present.
The visualized soft tissues and bony thorax are unremarkable.
IMPRESSION: No active cardiopulmonary disease.

## 2019-03-09 ENCOUNTER — Other Ambulatory Visit: Payer: Self-pay | Admitting: *Deleted

## 2019-03-09 DIAGNOSIS — Z20822 Contact with and (suspected) exposure to covid-19: Secondary | ICD-10-CM

## 2019-03-11 LAB — NOVEL CORONAVIRUS, NAA: SARS-CoV-2, NAA: NOT DETECTED

## 2020-01-12 ENCOUNTER — Other Ambulatory Visit: Payer: Self-pay

## 2020-01-12 ENCOUNTER — Encounter: Payer: Self-pay | Admitting: Family Medicine

## 2020-01-12 ENCOUNTER — Ambulatory Visit: Payer: 59 | Admitting: Family Medicine

## 2020-01-12 VITALS — BP 161/96 | HR 69 | Resp 16 | Ht 70.5 in | Wt 227.0 lb

## 2020-01-12 DIAGNOSIS — E6609 Other obesity due to excess calories: Secondary | ICD-10-CM | POA: Diagnosis not present

## 2020-01-12 DIAGNOSIS — G4733 Obstructive sleep apnea (adult) (pediatric): Secondary | ICD-10-CM

## 2020-01-12 DIAGNOSIS — Z720 Tobacco use: Secondary | ICD-10-CM | POA: Insufficient documentation

## 2020-01-12 DIAGNOSIS — I10 Essential (primary) hypertension: Secondary | ICD-10-CM | POA: Diagnosis not present

## 2020-01-12 DIAGNOSIS — E785 Hyperlipidemia, unspecified: Secondary | ICD-10-CM

## 2020-01-12 DIAGNOSIS — Z6832 Body mass index (BMI) 32.0-32.9, adult: Secondary | ICD-10-CM

## 2020-01-12 NOTE — Assessment & Plan Note (Signed)
Charles Holloway is educated about the importance of exercise daily to help with weight management. A minumum of 30 minutes daily is recommended. Additionally, importance of healthy food choices  with portion control discussed.   Wt Readings from Last 3 Encounters:  01/12/20 (!) 227 lb 0.6 oz (103 kg)  06/26/16 220 lb (99.8 kg)  03/29/06 (!) 225 lb (102.1 kg)

## 2020-01-12 NOTE — Assessment & Plan Note (Signed)
Asked about quitting: confirms they are currently chewing tobacco Advise to quit smoking: Educated about QUITTING to reduce the risk of cancer, cardio and cerebrovascular disease. Assess willingness: Unwilling to quit at this time, but is working on cutting back. Assist with counseling and pharmacotherapy: Counseled for 5 minutes and literature provided. Arrange for follow up: not quitting follow up in 3 months and continue to offer help.

## 2020-01-12 NOTE — Assessment & Plan Note (Signed)
Encouraged low fat diet, will get updated labs in Dec

## 2020-01-12 NOTE — Assessment & Plan Note (Signed)
Charles Holloway is encouraged to maintain a well balanced diet that is low in salt. Additionally, he is also reminded that exercise is beneficial for heart health and control of  Blood pressure. 30-60 minutes daily is recommended-walking was suggested.  He reports white coat syndrome, advise that you can still develop HTN later in life, family hx as well. Encouraged to check BP at home. We will check outside of office at next visit.

## 2020-01-12 NOTE — Assessment & Plan Note (Signed)
Uses CPAP, will need referral to [facility] for management
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
## 2020-01-12 NOTE — Patient Instructions (Addendum)
I appreciate the opportunity to provide you with care for your health and wellness. Today we discussed: established care   Follow up: Dec for annual (fasting morning)  No labs or referrals today  GREAT TO MEET YOU TODAY!  Please continue to practice social distancing to keep you, your family, and our community safe.  If you must go out, please wear a mask and practice good handwashing.  It was a pleasure to see you and I look forward to continuing to work together on your health and well-being. Please do not hesitate to call the office if you need care or have questions about your care.  Have a wonderful day and week. With Gratitude, Tereasa Coop, DNP, AGNP-BC

## 2020-06-05 ENCOUNTER — Encounter: Payer: Self-pay | Admitting: Family Medicine

## 2020-06-05 ENCOUNTER — Ambulatory Visit: Payer: 59 | Admitting: Family Medicine

## 2020-06-05 ENCOUNTER — Other Ambulatory Visit: Payer: Self-pay

## 2020-06-05 VITALS — BP 144/80 | HR 68 | Temp 97.7°F | Ht 70.5 in | Wt 222.0 lb

## 2020-06-05 DIAGNOSIS — E6609 Other obesity due to excess calories: Secondary | ICD-10-CM | POA: Diagnosis not present

## 2020-06-05 DIAGNOSIS — E785 Hyperlipidemia, unspecified: Secondary | ICD-10-CM

## 2020-06-05 DIAGNOSIS — Z6832 Body mass index (BMI) 32.0-32.9, adult: Secondary | ICD-10-CM

## 2020-06-05 DIAGNOSIS — I10 Essential (primary) hypertension: Secondary | ICD-10-CM | POA: Diagnosis not present

## 2020-06-05 DIAGNOSIS — Z720 Tobacco use: Secondary | ICD-10-CM

## 2020-06-05 DIAGNOSIS — Z0001 Encounter for general adult medical examination with abnormal findings: Secondary | ICD-10-CM | POA: Diagnosis not present

## 2020-06-05 DIAGNOSIS — G4733 Obstructive sleep apnea (adult) (pediatric): Secondary | ICD-10-CM

## 2020-06-05 NOTE — Assessment & Plan Note (Addendum)
Has not used a lot due to drying out as it does not have a humidifier- at least this time of the year. Needs to get an updated machined. Will try to order one. If not able- will refer to Dr Gerilyn Pilgrim.

## 2020-06-05 NOTE — Patient Instructions (Signed)
  I appreciate the opportunity to provide you with care for your health and wellness. Today we discussed: overall health  Follow up: 1 year for CPE -fasting labs   Labs- today  No referrals today Order for cpap will be placed, if insurance will not cover please let me know.  MERRY CHRISTMAS AND HAPPY NEW YEAR :)  Please continue to practice social distancing to keep you, your family, and our community safe.  If you must go out, please wear a mask and practice good handwashing.  It was a pleasure to see you and I look forward to continuing to work together on your health and well-being. Please do not hesitate to call the office if you need care or have questions about your care.  Have a wonderful day. With Gratitude, Tereasa Coop, DNP, AGNP-BC HEALTH MAINTENANCE RECOMMENDATIONS:  It is recommended that you get at least 30 minutes of aerobic exercise at least 5 days/week (for weight loss, you may need as much as 60-90 minutes). This can be any activity that gets your heart rate up. This can be divided in 10-15 minute intervals if needed, but try and build up your endurance at least once a week.  Weight bearing exercise is also recommended twice weekly.  Eat a healthy diet with lots of vegetables, fruits and fiber.  "Colorful" foods have a lot of vitamins (ie green vegetables, tomatoes, red peppers, etc).  Limit sweet tea, regular sodas and alcoholic beverages, all of which has a lot of calories and sugar.  Up to 2 alcoholic drinks daily may be beneficial for men (unless trying to lose weight, watch sugars).  Drink a lot of water.  Sunscreen of at least SPF 30 should be used on all sun-exposed parts of the skin when outside between the hours of 10 am and 4 pm (not just when at beach or pool, but even with exercise, golf, tennis, and yard work!)  Use a sunscreen that says "broad spectrum" so it covers both UVA and UVB rays, and make sure to reapply every 1-2 hours.  Remember to change the  batteries in your smoke detectors when changing your clock times in the spring and fall.  Use your seat belt every time you are in a car, and please drive safely and not be distracted with cell phones and texting while driving.

## 2020-06-05 NOTE — Progress Notes (Signed)
Health Maintenance reviewed -  Immunization History  Administered Date(s) Administered  . Influenza Whole 04/06/2008   Last colonoscopy: Cologuard 2019 -negative  Last PSA: Ordered today  Dentist: Twice yearly- needs cleaning and crown  Ophtho: Wears readers  Exercise: Nothing structured outside work Smoker: no Alcohol Use: social   Other doctors caring for patient include:  Patient Care Team: Freddy Finner, NP as PCP - General (Family Medicine)  End of Life Discussion:  Patient does not have a living will and medical power of attorney  Subjective:   HPI  Charles Holloway is a 54 y.o. male who presents for annual visit and follow-up on chronic medical conditions.  He has the following concerns: none  Denies having any sleep issues.  Denies having a chewing or swallowing trouble.  Denies having trouble with bowel or bladder habits no changes in bowel or bladder habits does not have any blood in urine or stool.  Denies having any memory issues.  Denies any recent injuries or fall.  Denies having any skin, hearing or vision changes.  Review Of Systems  Review of Systems  Constitutional: Negative.   HENT: Negative.   Eyes: Negative.   Respiratory: Negative.   Cardiovascular: Negative.   Gastrointestinal: Negative.   Endocrine: Negative.   Genitourinary: Negative.   Musculoskeletal: Negative.   Skin: Negative.   Neurological: Negative.   Psychiatric/Behavioral: Negative.     Objective:   PHYSICAL EXAM:  BP (!) 144/80 (BP Location: Right Arm, Patient Position: Sitting, Cuff Size: Normal)   Pulse 68   Temp 97.7 F (36.5 C) (Temporal)   Ht 5' 10.5" (1.791 m)   Wt 222 lb (100.7 kg)   SpO2 98%   BMI 31.40 kg/m    Physical Exam Vitals and nursing note reviewed.  Constitutional:      Appearance: Normal appearance. He is well-developed and well-groomed. He is obese.  HENT:     Head: Normocephalic and atraumatic.     Right Ear: Hearing, tympanic membrane,  ear canal and external ear normal.     Left Ear: Hearing, tympanic membrane, ear canal and external ear normal.     Nose: Nose normal.     Mouth/Throat:     Lips: Pink.     Mouth: Mucous membranes are moist.     Pharynx: Oropharynx is clear. Uvula midline.  Eyes:     General: Lids are normal.        Right eye: No discharge.        Left eye: No discharge.     Extraocular Movements: Extraocular movements intact.     Conjunctiva/sclera: Conjunctivae normal.     Pupils: Pupils are equal, round, and reactive to light.  Neck:     Thyroid: No thyroid mass, thyromegaly or thyroid tenderness.     Vascular: No carotid bruit.  Cardiovascular:     Rate and Rhythm: Normal rate and regular rhythm.     Pulses: Normal pulses.     Heart sounds: Normal heart sounds.  Pulmonary:     Effort: Pulmonary effort is normal.     Breath sounds: Normal breath sounds.  Abdominal:     General: Abdomen is flat. Bowel sounds are normal. There is no distension.     Palpations: Abdomen is soft.     Tenderness: There is no abdominal tenderness. There is no right CVA tenderness or left CVA tenderness.     Hernia: No hernia is present.  Musculoskeletal:  General: Normal range of motion.     Cervical back: Full passive range of motion without pain, normal range of motion and neck supple.     Right lower leg: No edema.     Left lower leg: No edema.     Comments: MAE, ROM intact  Lymphadenopathy:     Cervical: No cervical adenopathy.  Skin:    General: Skin is warm and dry.     Capillary Refill: Capillary refill takes less than 2 seconds.  Neurological:     General: No focal deficit present.     Mental Status: He is alert and oriented to person, place, and time.     Cranial Nerves: Cranial nerves are intact.     Sensory: Sensation is intact.     Motor: Motor function is intact.     Coordination: Coordination is intact.     Gait: Gait is intact.     Deep Tendon Reflexes: Reflexes are normal and  symmetric.  Psychiatric:        Attention and Perception: Attention normal.        Mood and Affect: Mood and affect normal.        Speech: Speech normal.        Behavior: Behavior normal. Behavior is cooperative.        Thought Content: Thought content normal.        Cognition and Memory: Cognition and memory normal.        Judgment: Judgment normal.      Depression Screening  Depression screen [facility] 2/9 06/05/2020 01/12/2020 01/12/2020  Decreased Interest 0 0 0  Down, Depressed, Hopeless 0 0 0  PHQ - 2 Score 0 0 0     Falls  Fall Risk  06/05/2020 01/12/2020  Falls in the past year? 0 1  Number falls in past yr: 0 0  Injury with Fall? 0 1  Risk for fall due to : No Fall Risks -  Follow up Falls evaluation completed -    Assessment & Plan:   1. Annual visit for general adult medical examination with abnormal findings   2. Class 1 obesity due to excess calories without serious comorbidity with body mass index (BMI) of 32.0 to 32.9 in adult   3. Essential hypertension   4. OBSTRUCTIVE SLEEP APNEA   5. Hyperlipidemia, unspecified hyperlipidemia type   6. Chews tobacco     Tests ordered Orders Placed This Encounter  Procedures  . For home use only DME continuous positive airway pressure (CPAP)  . CBC  . Comprehensive metabolic panel  . Hemoglobin A1c  . Lipid panel  . PSA  . TSH     Plan: Please see assessment and plan per problem list above.   No orders of the defined types were placed in this encounter.     I have personally reviewed: The patient's medical and social history Their use of alcohol, tobacco or illicit drugs Their current medications and supplements The patient's functional ability including ADLs,fall risks, home safety risks, cognitive, and hearing and visual impairment Diet and physical activities Evidence for depression or mood disorders  The patient's weight, height, BMI, and visual acuity have been recorded in the chart.  I have made  referrals, counseling, and provided education to the patient based on review of the above and I have provided the patient with a written personalized care plan for preventive services.     Freddy Finner, NP   06/05/2020

## 2020-06-05 NOTE — Assessment & Plan Note (Signed)
Updated labs ordered today, heart health diet encouraged

## 2020-06-05 NOTE — Assessment & Plan Note (Signed)
Charles Holloway is encouraged to maintain a well balanced diet that is low in salt. Higher end of normal, but improved from last visit.  Additionally, he is also reminded that exercise is beneficial for heart health and control of  Blood pressure. 30-60 minutes daily is recommended-walking was suggested.  Wants to remain off medication at this time

## 2020-06-05 NOTE — Assessment & Plan Note (Signed)
Improved  Blima Dessert is re-educated about the importance of exercise daily to help with weight management. A minumum of 30 minutes daily is recommended. Additionally, importance of healthy food choices  with portion control discussed.  Wt Readings from Last 3 Encounters:  06/05/20 222 lb (100.7 kg)  01/12/20 (!) 227 lb 0.6 oz (103 kg)  06/26/16 220 lb (99.8 kg)

## 2020-06-05 NOTE — Assessment & Plan Note (Signed)
Discussed PSA screening (risks/benefits), recommended at least 30 minutes of aerobic activity at least 5 days/week; proper sunscreen use reviewed; healthy diet and alcohol recommendations (less than or equal to 2 drinks/day) reviewed; regular seatbelt use; changing batteries in smoke detectors. Immunization recommendations discussed.  Colonoscopy recommendations reviewed. ° °

## 2020-06-05 NOTE — Assessment & Plan Note (Signed)
Asked about quitting: confirms chewing tobacco- 1 can a week. Advise to quit smoking: Educated about QUITTING to reduce the risk of cancer, cardio and cerebrovascular disease. Assess willingness: Unwilling to quit at this time, but is working on cutting back. Assist with counseling and pharmacotherapy: Counseled for 5 minutes and literature provided. Arrange for follow up: not quitting follow up in 3 months and continue to offer help.

## 2020-06-06 LAB — CBC
Hematocrit: 46 % (ref 37.5–51.0)
Hemoglobin: 16 g/dL (ref 13.0–17.7)
MCH: 30.4 pg (ref 26.6–33.0)
MCHC: 34.8 g/dL (ref 31.5–35.7)
MCV: 88 fL (ref 79–97)
Platelets: 228 10*3/uL (ref 150–450)
RBC: 5.26 x10E6/uL (ref 4.14–5.80)
RDW: 12.6 % (ref 11.6–15.4)
WBC: 8 10*3/uL (ref 3.4–10.8)

## 2020-06-06 LAB — COMPREHENSIVE METABOLIC PANEL
ALT: 34 IU/L (ref 0–44)
AST: 27 IU/L (ref 0–40)
Albumin/Globulin Ratio: 1.9 (ref 1.2–2.2)
Albumin: 4.6 g/dL (ref 3.8–4.9)
Alkaline Phosphatase: 68 IU/L (ref 44–121)
BUN/Creatinine Ratio: 13 (ref 9–20)
BUN: 14 mg/dL (ref 6–24)
Bilirubin Total: 0.5 mg/dL (ref 0.0–1.2)
CO2: 23 mmol/L (ref 20–29)
Calcium: 8.1 mg/dL — ABNORMAL LOW (ref 8.7–10.2)
Chloride: 101 mmol/L (ref 96–106)
Creatinine, Ser: 1.04 mg/dL (ref 0.76–1.27)
GFR calc Af Amer: 94 mL/min/{1.73_m2} (ref >59)
GFR calc non Af Amer: 81 mL/min/{1.73_m2} (ref >59)
Globulin, Total: 2.4 g/dL (ref 1.5–4.5)
Glucose: 105 mg/dL — ABNORMAL HIGH (ref 65–99)
Potassium: 4.7 mmol/L (ref 3.5–5.2)
Sodium: 139 mmol/L (ref 134–144)
Total Protein: 7 g/dL (ref 6.0–8.5)

## 2020-06-06 LAB — LIPID PANEL
Chol/HDL Ratio: 6.7 ratio — ABNORMAL HIGH (ref 0.0–5.0)
Cholesterol, Total: 200 mg/dL — ABNORMAL HIGH (ref 100–199)
HDL: 30 mg/dL — ABNORMAL LOW (ref >39)
LDL Chol Calc (NIH): 128 mg/dL — ABNORMAL HIGH (ref 0–99)
Triglycerides: 235 mg/dL — ABNORMAL HIGH (ref 0–149)
VLDL Cholesterol Cal: 42 mg/dL — ABNORMAL HIGH (ref 5–40)

## 2020-06-06 LAB — HEMOGLOBIN A1C
Est. average glucose Bld gHb Est-mCnc: 117 mg/dL
Hgb A1c MFr Bld: 5.7 % — ABNORMAL HIGH (ref 4.8–5.6)

## 2020-06-06 LAB — TSH: TSH: 1.94 u[IU]/mL (ref 0.450–4.500)

## 2020-06-06 LAB — PSA: Prostate Specific Ag, Serum: 2.3 ng/mL (ref 0.0–4.0)

## 2021-06-05 ENCOUNTER — Other Ambulatory Visit: Payer: Self-pay

## 2021-06-05 ENCOUNTER — Encounter: Payer: Self-pay | Admitting: Nurse Practitioner

## 2021-06-05 ENCOUNTER — Encounter: Payer: 59 | Admitting: Family Medicine

## 2021-06-05 ENCOUNTER — Ambulatory Visit (INDEPENDENT_AMBULATORY_CARE_PROVIDER_SITE_OTHER): Payer: 59 | Admitting: Nurse Practitioner

## 2021-06-05 VITALS — BP 155/93 | HR 70 | Ht 70.0 in | Wt 212.1 lb

## 2021-06-05 DIAGNOSIS — Z0001 Encounter for general adult medical examination with abnormal findings: Secondary | ICD-10-CM

## 2021-06-05 DIAGNOSIS — I10 Essential (primary) hypertension: Secondary | ICD-10-CM

## 2021-06-05 DIAGNOSIS — E785 Hyperlipidemia, unspecified: Secondary | ICD-10-CM

## 2021-06-05 DIAGNOSIS — Z139 Encounter for screening, unspecified: Secondary | ICD-10-CM | POA: Diagnosis not present

## 2021-06-05 NOTE — Progress Notes (Signed)
Acute Office Visit  Subjective:    Patient ID: Charles Holloway, male    DOB: 01-30-1966, 55 y.o.   MRN: [id number]  Chief Complaint  Patient presents with   Annual Exam    CPE    HPI Patient is in today for physical exam. No acute concerns.  Past Medical History:  Diagnosis Date   ERECTILE DYSFUNCTION 05/11/2006   Qualifier: Diagnosis of  By: Truett Mainland MD, Christine     FATIGUE 12/31/2006   Qualifier: Diagnosis of  By: Truett Mainland MD, Colin Broach 05/11/2006   Qualifier: Diagnosis of  By: Truett Mainland MD, Christine     Kidney stones    Sleep apnea    [employer] 01/09/2020   TOE PAIN 11/29/2007   Qualifier: Diagnosis of  By: Truett Mainland MD, Christine     VASOVAGAL SYNCOPE 12/14/2008   Qualifier: Diagnosis of  By: Truett Mainland MD, Christine      Past Surgical History:  Procedure Laterality Date   TONSILLECTOMY     VASECTOMY      Family History  Problem Relation Age of Onset   Alcohol abuse Mother    Diabetes Mother    Heart disease Mother    Kidney disease Mother    Heart disease Father    Diabetes Father    Liver disease Father        hx of liver transplant   Cancer Father     Social History   Socioeconomic History   Marital status: Married    Spouse name: Phillyis    Number of children: 2   Years of education: Not on file   Highest education level: Some college, no degree  Occupational History   Not on file  Tobacco Use   Smoking status: Former   Smokeless tobacco: Current    Types: Chew  Substance and Sexual Activity   Alcohol use: Yes    Alcohol/week: 1.0 - 2.0 standard drink    Types: 1 - 2 Cans of beer per week    Comment: weekly   Drug use: No   Sexual activity: Yes    Birth control/protection: Surgical  Other Topics Concern   Not on file  Social History Narrative   Lives with wife of 61 years in Oct   2 sons: one is in [place], [place] and the other is at home with him with [place]      Dogs: pup, gina, charlie   Cat: chet       Enjoy: working, outside  things      Diet: eats all food groups    Caffeine:  Limited    Water: 6-10 cups daily       Wears seat belt    Does not use phone while driving    Oceanographer at home, Multimedia programmer location     Social Determinants of Radio broadcast assistant Strain: Low Risk    Difficulty of Paying Living Expenses: Not hard at all  Food Insecurity: No Food Insecurity   Worried About Charity fundraiser in the Last Year: Never true   Arboriculturist in the Last Year: Never true  Transportation Needs: No Transportation Needs   Lack of Transportation (Medical): No   Lack of Transportation (Non-Medical): No  Physical Activity: Sufficiently Active   Days of Exercise per Week: 5 days   Minutes of Exercise per Session: 120 min  Stress: No Stress Concern Present   Feeling of Stress :  Not at all  Social Connections: Socially Integrated   Frequency of Communication with Friends and Family: More than three times a week   Frequency of Social Gatherings with Friends and Family: Twice a week   Attends Religious Services: More than 4 times per year   Active Member of [employer] or Organizations: Yes   Attends Music therapist: More than 4 times per year   Marital Status: Married  Human resources officer Violence: Not At Risk   Fear of Current or Ex-Partner: No   Emotionally Abused: No   Physically Abused: No   Sexually Abused: No    No outpatient medications prior to visit.   No facility-administered medications prior to visit.    Allergies  Allergen Reactions   Penicillins Rash    Has patient had a PCN reaction causing immediate rash, facial/tongue/throat swelling, SOB or lightheadedness with hypotension: Yes Has patient had a PCN reaction causing severe rash involving mucus membranes or skin necrosis: Yes Has patient had a PCN reaction that required hospitalization No Has patient had a PCN reaction occurring within the last 10 years: Yes *Increases fever levels If  all of the above answers are "NO", then may proceed with Cephalosporin use.     Review of Systems  Constitutional: Negative.   HENT: Negative.    Eyes: Negative.   Respiratory: Negative.    Cardiovascular: Negative.   Gastrointestinal: Negative.   Endocrine: Negative.   Genitourinary: Negative.   Musculoskeletal: Negative.   Skin: Negative.   Allergic/Immunologic: Negative.   Neurological: Negative.   Hematological: Negative.   Psychiatric/Behavioral: Negative.        Objective:    Physical Exam Constitutional:      Appearance: Normal appearance.  HENT:     Head: Normocephalic and atraumatic.     Right Ear: Tympanic membrane, ear canal and external ear normal.     Left Ear: Tympanic membrane, ear canal and external ear normal.     Nose: Nose normal.     Mouth/Throat:     Mouth: Mucous membranes are moist.     Pharynx: Oropharynx is clear.  Eyes:     Extraocular Movements: Extraocular movements intact.     Conjunctiva/sclera: Conjunctivae normal.     Pupils: Pupils are equal, round, and reactive to light.  Cardiovascular:     Rate and Rhythm: Normal rate and regular rhythm.     Pulses: Normal pulses.     Heart sounds: Normal heart sounds.  Pulmonary:     Effort: Pulmonary effort is normal.     Breath sounds: Normal breath sounds.  Abdominal:     General: Abdomen is flat. Bowel sounds are normal.     Palpations: Abdomen is soft.  Musculoskeletal:        General: Normal range of motion.     Cervical back: Normal range of motion and neck supple.  Skin:    General: Skin is warm and dry.     Capillary Refill: Capillary refill takes less than 2 seconds.  Neurological:     General: No focal deficit present.     Mental Status: He is alert and oriented to person, place, and time.     Cranial Nerves: No cranial nerve deficit.     Sensory: No sensory deficit.     Motor: No weakness.     Coordination: Coordination normal.     Gait: Gait normal.  Psychiatric:         Mood and Affect: Mood normal.  Behavior: Behavior normal.        Thought Content: Thought content normal.        Judgment: Judgment normal.    BP (!) 155/93    Pulse 70    Ht 5' 10"  (1.778 m)    Wt 212 lb 1.9 oz (96.2 kg)    SpO2 95%    BMI 30.44 kg/m  Wt Readings from Last 3 Encounters:  06/05/21 212 lb 1.9 oz (96.2 kg)  06/05/20 222 lb (100.7 kg)  01/12/20 (!) 227 lb 0.6 oz (103 kg)    Health Maintenance Due  Topic Date Due   HIV Screening  Never done   Hepatitis C Screening  Never done    There are no preventive care reminders to display for this patient.   Lab Results  Component Value Date   TSH 1.940 06/05/2020   Lab Results  Component Value Date   WBC 8.0 06/05/2020   HGB 16.0 06/05/2020   HCT 46.0 06/05/2020   MCV 88 06/05/2020   PLT 228 06/05/2020   Lab Results  Component Value Date   NA 139 06/05/2020   K 4.7 06/05/2020   CO2 23 06/05/2020   GLUCOSE 105 (H) 06/05/2020   BUN 14 06/05/2020   CREATININE 1.04 06/05/2020   BILITOT 0.5 06/05/2020   ALKPHOS 68 06/05/2020   AST 27 06/05/2020   ALT 34 06/05/2020   PROT 7.0 06/05/2020   ALBUMIN 4.6 06/05/2020   CALCIUM 8.1 (L) 06/05/2020   ANIONGAP 12 06/26/2016   Lab Results  Component Value Date   CHOL 200 (H) 06/05/2020   Lab Results  Component Value Date   HDL 30 (L) 06/05/2020   Lab Results  Component Value Date   LDLCALC 128 (H) 06/05/2020   Lab Results  Component Value Date   TRIG 235 (H) 06/05/2020   Lab Results  Component Value Date   CHOLHDL 6.7 (H) 06/05/2020   Lab Results  Component Value Date   HGBA1C 5.7 (H) 06/05/2020       Assessment & Plan:   Problem List Items Addressed This Visit       Cardiovascular and Mediastinum   Essential hypertension    BP Readings from Last 3 Encounters:  06/05/21 (!) 155/93  06/05/20 (!) 144/80  01/12/20 (!) 161/96  -BP elevated today -we discussed starting olmesartan, but he declined today -he states he has white coat HTN and  he will check BP at home       Relevant Orders   CBC with Differential/Platelet   CMP14+EGFR   Lipid Panel With LDL/HDL Ratio     Other   Hyperlipemia    -checking lipid panels today      Relevant Orders   Lipid Panel With LDL/HDL Ratio   Encounter for general adult medical examination with abnormal findings - Primary    -elevated BP on exam      Relevant Orders   CBC with Differential/Platelet   CMP14+EGFR   Lipid Panel With LDL/HDL Ratio   Hepatitis C antibody   HIV Antibody (routine testing w rflx)   TSH   Screening due    -screen for HIV and HCV today      Relevant Orders   Hepatitis C antibody   HIV Antibody (routine testing w rflx)     No orders of the defined types were placed in this encounter.    Noreene Larsson, NP

## 2021-06-05 NOTE — Assessment & Plan Note (Signed)
-  screen for HIV and HCV today

## 2021-06-05 NOTE — Patient Instructions (Addendum)
Please have fasting labs drawn today.  Please monitor your blood pressure (BP) twice per week. If BP is above 140 on top (systolic) or above 90 on the bottom (diastolic) consistently, call the clinic and we can send in some BP medication.   I will be moving to [facility] located at [address], [place], [place] [address] effective Jun 22, 2021. If you would like to establish care with [facility] please call [phone].

## 2021-06-05 NOTE — Assessment & Plan Note (Addendum)
BP Readings from Last 3 Encounters:  06/05/21 (!) 155/93  06/05/20 (!) 144/80  01/12/20 (!) 161/96   -BP elevated today -we discussed starting olmesartan, but he declined today -he states he has white coat HTN and he will check BP at home

## 2021-06-05 NOTE — Assessment & Plan Note (Signed)
-  checking lipid panels today

## 2021-06-05 NOTE — Assessment & Plan Note (Signed)
-  elevated BP on exam

## 2021-06-06 ENCOUNTER — Other Ambulatory Visit: Payer: Self-pay | Admitting: Nurse Practitioner

## 2021-06-06 DIAGNOSIS — E785 Hyperlipidemia, unspecified: Secondary | ICD-10-CM

## 2021-06-06 LAB — CBC WITH DIFFERENTIAL/PLATELET
Basophils Absolute: 0.1 10*3/uL (ref 0.0–0.2)
Basos: 1 %
EOS (ABSOLUTE): 0.1 10*3/uL (ref 0.0–0.4)
Eos: 1 %
Hematocrit: 45.5 % (ref 37.5–51.0)
Hemoglobin: 16 g/dL (ref 13.0–17.7)
Immature Grans (Abs): 0 10*3/uL (ref 0.0–0.1)
Immature Granulocytes: 0 %
Lymphocytes Absolute: 3 10*3/uL (ref 0.7–3.1)
Lymphs: 26 %
MCH: 30.8 pg (ref 26.6–33.0)
MCHC: 35.2 g/dL (ref 31.5–35.7)
MCV: 88 fL (ref 79–97)
Monocytes Absolute: 0.7 10*3/uL (ref 0.1–0.9)
Monocytes: 6 %
Neutrophils Absolute: 7.6 10*3/uL — ABNORMAL HIGH (ref 1.4–7.0)
Neutrophils: 66 %
Platelets: 206 10*3/uL (ref 150–450)
RBC: 5.2 x10E6/uL (ref 4.14–5.80)
RDW: 12.4 % (ref 11.6–15.4)
WBC: 11.5 10*3/uL — ABNORMAL HIGH (ref 3.4–10.8)

## 2021-06-06 LAB — CMP14+EGFR
ALT: 25 IU/L (ref 0–44)
AST: 22 IU/L (ref 0–40)
Albumin/Globulin Ratio: 1.7 (ref 1.2–2.2)
Albumin: 4.8 g/dL (ref 3.8–4.9)
Alkaline Phosphatase: 75 IU/L (ref 44–121)
BUN/Creatinine Ratio: 14 (ref 9–20)
BUN: 16 mg/dL (ref 6–24)
Bilirubin Total: 0.5 mg/dL (ref 0.0–1.2)
CO2: 22 mmol/L (ref 20–29)
Calcium: 9.9 mg/dL (ref 8.7–10.2)
Chloride: 101 mmol/L (ref 96–106)
Creatinine, Ser: 1.13 mg/dL (ref 0.76–1.27)
Globulin, Total: 2.8 g/dL (ref 1.5–4.5)
Glucose: 90 mg/dL (ref 70–99)
Potassium: 4.8 mmol/L (ref 3.5–5.2)
Sodium: 140 mmol/L (ref 134–144)
Total Protein: 7.6 g/dL (ref 6.0–8.5)
eGFR: 77 mL/min/{1.73_m2} (ref >59)

## 2021-06-06 LAB — HEPATITIS C ANTIBODY: Hep C Virus Ab: 0.1 s/co ratio (ref 0.0–0.9)

## 2021-06-06 LAB — HIV ANTIBODY (ROUTINE TESTING W REFLEX): HIV Screen 4th Generation wRfx: NONREACTIVE

## 2021-06-06 LAB — LIPID PANEL WITH LDL/HDL RATIO
Cholesterol, Total: 203 mg/dL — ABNORMAL HIGH (ref 100–199)
HDL: 34 mg/dL — ABNORMAL LOW (ref >39)
LDL Chol Calc (NIH): 148 mg/dL — ABNORMAL HIGH (ref 0–99)
LDL/HDL Ratio: 4.4 ratio — ABNORMAL HIGH (ref 0.0–3.6)
Triglycerides: 117 mg/dL (ref 0–149)
VLDL Cholesterol Cal: 21 mg/dL (ref 5–40)

## 2021-06-06 LAB — TSH: TSH: 2.69 u[IU]/mL (ref 0.450–4.500)

## 2021-06-06 MED ORDER — ROSUVASTATIN CALCIUM 20 MG PO TABS: 20.0000 mg | ORAL_TABLET | Freq: Every day | ORAL | 3 refills | Status: DC

## 2021-06-06 NOTE — Progress Notes (Signed)
WBC and cholesterol are elevated. I sent in some cholesterol medicine since the goal is < 100 and you were at 148 on your LDL. Diet and exercise alone aren't likely to bring you down to goal.

## 2021-06-06 NOTE — Progress Notes (Signed)
Please get him a lab appt with Charles Holloway in 3 months.

## 2021-07-04 ENCOUNTER — Other Ambulatory Visit: Payer: Self-pay

## 2021-07-04 ENCOUNTER — Ambulatory Visit: Payer: 59 | Admitting: Family Medicine

## 2021-07-04 DIAGNOSIS — E785 Hyperlipidemia, unspecified: Secondary | ICD-10-CM

## 2021-07-04 DIAGNOSIS — I10 Essential (primary) hypertension: Secondary | ICD-10-CM

## 2021-07-04 NOTE — Progress Notes (Signed)
Subjective:  Patient ID: Charles Holloway, male    DOB: 11/17/1965  Age: 56 y.o. MRN: [id number]  CC: Chief Complaint  Patient presents with   Establish Care   bp elevation    Home readings 138/ to 145/ 69- 75     HPI:  56 year old male with hypertension and hyperlipidemia presents as new patient to establish care.  Patient has essential hypertension.  He is not on pharmacotherapy at this time.  He has been watching his blood pressures closely.  His systolic blood pressures are in the 130s predominantly via his home readings.  Diastolic blood pressures below 80 at home.  He states that he is feeling well.  This is not at goal.  Patient recently placed on statin but has not taken the medication.  Last LDL was 148.  Normal triglycerides.  Patient Active Problem List   Diagnosis Date Noted   Chews tobacco 01/12/2020   Hyperlipemia 12/31/2006   OSA (obstructive sleep apnea) 05/11/2006   Essential hypertension 05/11/2006    Social Hx   Social History   Socioeconomic History   Marital status: Married    Spouse name: Phillyis    Number of children: 2   Years of education: Not on file   Highest education level: Some college, no degree  Occupational History   Not on file  Tobacco Use   Smoking status: Former   Smokeless tobacco: Current    Types: Chew  Substance and Sexual Activity   Alcohol use: Yes    Alcohol/week: 1.0 - 2.0 standard drink    Types: 1 - 2 Cans of beer per week    Comment: weekly   Drug use: No   Sexual activity: Yes    Birth control/protection: Surgical  Other Topics Concern   Not on file  Social History Narrative   Lives with wife of 27 years in Oct   2 sons: one is in [place], [place] and the other is at home with him with [place]      Dogs: pup, gina, charlie   Cat: chet       Enjoy: working, outside things      Diet: eats all food groups    Caffeine:  Limited    Water: 6-10 cups daily       Wears seat belt    Does not use phone while  driving    Oceanographer at home, Multimedia programmer location     Social Determinants of Health   Financial Resource Strain: Not on file  Food Insecurity: Not on file  Transportation Needs: Not on file  Physical Activity: Not on file  Stress: Not on file  Social Connections: Not on file    Review of Systems  Constitutional: Negative.   Respiratory: Negative.    Cardiovascular: Negative.     Objective:  BP 140/80    Pulse 72    Temp 98.2 F (36.8 C)    Ht 5\' 10"  (1.778 m)    Wt 214 lb (97.1 kg)    SpO2 98%    BMI 30.71 kg/m   BP/Weight 07/04/2021 06/05/2021 [id number]  Systolic BP [id number]  Diastolic BP 80 93 80  Wt. (Lbs) 214 212.12 222  BMI 30.71 30.44 31.4    Physical Exam Vitals and nursing note reviewed.  Constitutional:      General: He is not in acute distress.    Appearance: Normal appearance. He is not ill-appearing.  HENT:  Head: Normocephalic and atraumatic.  Cardiovascular:     Rate and Rhythm: Normal rate and regular rhythm.  Pulmonary:     Effort: Pulmonary effort is normal.     Breath sounds: Normal breath sounds. No wheezing, rhonchi or rales.  Neurological:     Mental Status: He is alert.  Psychiatric:        Mood and Affect: Mood normal.        Behavior: Behavior normal.    Lab Results  Component Value Date   WBC 11.5 (H) 06/05/2021   HGB 16.0 06/05/2021   HCT 45.5 06/05/2021   PLT 206 06/05/2021   GLUCOSE 90 06/05/2021   CHOL 203 (H) 06/05/2021   TRIG 117 06/05/2021   HDL 34 (L) 06/05/2021   LDLCALC 148 (H) 06/05/2021   ALT 25 06/05/2021   AST 22 06/05/2021   NA 140 06/05/2021   K 4.8 06/05/2021   CL 101 06/05/2021   CREATININE 1.13 06/05/2021   BUN 16 06/05/2021   CO2 22 06/05/2021   TSH 2.690 06/05/2021   PSA 0.95 03/08/2009   HGBA1C 5.7 (H) 06/05/2020     Assessment & Plan:   Problem List Items Addressed This Visit       Cardiovascular and Mediastinum   Essential hypertension    BPs are stable.   We discussed close monitoring versus initiating pharmacotherapy.  Patient elected for continued monitoring.  Advised to keep a close eye on his blood pressures at home.        Other   Hyperlipemia    Uncontrolled.  Lengthy discussion today.  Advised to consider lipid-lowering therapy.  Patient elects to work on lifestyle changes and not proceed with pharmacotherapy at this time.      Follow-up:  Return in about 6 months (around 01/01/2022).  [place]

## 2021-07-04 NOTE — Assessment & Plan Note (Signed)
BPs are stable.  We discussed close monitoring versus initiating pharmacotherapy.  Patient elected for continued monitoring.  Advised to keep a close eye on his blood pressures at home.

## 2021-07-04 NOTE — Assessment & Plan Note (Signed)
Uncontrolled.  Lengthy discussion today.  Advised to consider lipid-lowering therapy.  Patient elects to work on lifestyle changes and not proceed with pharmacotherapy at this time.

## 2021-07-04 NOTE — Patient Instructions (Signed)
Follow up in 6 months.  Keep a close eye on your BP.  Take care  Dr. Adriana Simas

## 2021-09-04 ENCOUNTER — Ambulatory Visit: Payer: 59 | Admitting: Internal Medicine

## 2022-01-02 ENCOUNTER — Ambulatory Visit: Payer: 59 | Admitting: Family Medicine

## 2022-01-12 ENCOUNTER — Ambulatory Visit: Payer: 59 | Admitting: Family Medicine

## 2022-01-12 VITALS — BP 138/72 | HR 64 | Temp 97.9°F | Ht 70.0 in | Wt 213.2 lb

## 2022-01-12 DIAGNOSIS — Z125 Encounter for screening for malignant neoplasm of prostate: Secondary | ICD-10-CM

## 2022-01-12 DIAGNOSIS — D72829 Elevated white blood cell count, unspecified: Secondary | ICD-10-CM | POA: Diagnosis not present

## 2022-01-12 DIAGNOSIS — I10 Essential (primary) hypertension: Secondary | ICD-10-CM | POA: Diagnosis not present

## 2022-01-12 DIAGNOSIS — E785 Hyperlipidemia, unspecified: Secondary | ICD-10-CM | POA: Insufficient documentation

## 2022-01-12 DIAGNOSIS — E782 Mixed hyperlipidemia: Secondary | ICD-10-CM | POA: Diagnosis not present

## 2022-01-12 NOTE — Progress Notes (Signed)
Subjective:  Patient ID: Charles Holloway, male    DOB: 26-Nov-1965  Age: 56 y.o. MRN: [id number]  CC: Chief Complaint  Patient presents with   Hyperlipidemia    Patient here for 6 month follow. No problems or concerns.   Hypertension    HPI:  56 year old male with hypertension, OSA, hyperlipidemia presents for follow-up.  BP fairly well controlled without pharmacotherapy.  BP 138/72 today.  He is feeling well.  Patient needs repeat labs regarding hyperlipidemia.  Last LDL was 148.  No pharmacotherapy at this time.  He was previously prescribed medication but he is no longer on this.  Patient Active Problem List   Diagnosis Date Noted   HLD (hyperlipidemia) 01/12/2022   OSA (obstructive sleep apnea) 05/11/2006   Essential hypertension 05/11/2006    Social Hx   Social History   Socioeconomic History   Marital status: Married    Spouse name: Phillyis    Number of children: 2   Years of education: Not on file   Highest education level: Some college, no degree  Occupational History   Not on file  Tobacco Use   Smoking status: Former   Smokeless tobacco: Current    Types: Chew  Substance and Sexual Activity   Alcohol use: Yes    Alcohol/week: 1.0 - 2.0 standard drink of alcohol    Types: 1 - 2 Cans of beer per week    Comment: weekly   Drug use: No   Sexual activity: Yes    Birth control/protection: Surgical  Other Topics Concern   Not on file  Social History Narrative   Lives with wife of 63 years in Oct   2 sons: one is in [place], [place] and the other is at home with him with [place]      Dogs: pup, gina, charlie   Cat: chet       Enjoy: working, outside things      Diet: eats all food groups    Caffeine:  Limited    Water: 6-10 cups daily       Wears seat belt    Does not use phone while driving    Oceanographer at home, Multimedia programmer location     Social Determinants of Health   Financial Resource Strain: Low Risk  (06/05/2020)    Overall Financial Resource Strain (CARDIA)    Difficulty of Paying Living Expenses: Not hard at all  Food Insecurity: No Food Insecurity (06/05/2020)   Hunger Vital Sign    Worried About Running Out of Food in the Last Year: Never true    [place] in the Last Year: Never true  Transportation Needs: No Transportation Needs (06/05/2020)   PRAPARE - Hydrologist (Medical): No    Lack of Transportation (Non-Medical): No  Physical Activity: Sufficiently Active (06/05/2020)   Exercise Vital Sign    Days of Exercise per Week: 5 days    Minutes of Exercise per Session: 120 min  Stress: No Stress Concern Present (06/05/2020)   [place]    Feeling of Stress : Not at all  Social Connections: [place] (06/05/2020)   Social Connection and Isolation Panel [NHANES]    Frequency of Communication with Friends and Family: More than three times a week    Frequency of Social Gatherings with Friends and Family: Twice a week    Attends Religious Services: More than 4 times  per year    Active Member of Clubs or Organizations: Yes    Attends Archivist Meetings: More than 4 times per year    Marital Status: Married    Review of Systems  Constitutional: Negative.   Respiratory: Negative.    Cardiovascular: Negative.    Objective:  BP 138/72   Pulse 64   Temp 97.9 F (36.6 C) (Oral)   Ht 5' 10"  (1.778 m)   Wt 213 lb 3.2 oz (96.7 kg)   SpO2 100%   BMI 30.59 kg/m      01/12/2022    8:32 AM 01/12/2022    8:19 AM 07/04/2021    8:27 AM  BP/Weight  Systolic BP 720 947 096  Diastolic BP 72 88 80  Wt. (Lbs)  213.2 214  BMI  30.59 kg/m2 30.71 kg/m2    Physical Exam Constitutional:      General: He is not in acute distress.    Appearance: Normal appearance.  HENT:     Head: Normocephalic and atraumatic.  Eyes:     General:        Right eye: No discharge.        Left  eye: No discharge.     Conjunctiva/sclera: Conjunctivae normal.  Cardiovascular:     Rate and Rhythm: Normal rate and regular rhythm.  Pulmonary:     Effort: Pulmonary effort is normal.     Breath sounds: Normal breath sounds. No wheezing, rhonchi or rales.  Neurological:     Mental Status: He is alert.  Psychiatric:        Mood and Affect: Mood normal.        Behavior: Behavior normal.     Lab Results  Component Value Date   WBC 11.5 (H) 06/05/2021   HGB 16.0 06/05/2021   HCT 45.5 06/05/2021   PLT 206 06/05/2021   GLUCOSE 90 06/05/2021   CHOL 203 (H) 06/05/2021   TRIG 117 06/05/2021   HDL 34 (L) 06/05/2021   LDLCALC 148 (H) 06/05/2021   ALT 25 06/05/2021   AST 22 06/05/2021   NA 140 06/05/2021   K 4.8 06/05/2021   CL 101 06/05/2021   CREATININE 1.13 06/05/2021   BUN 16 06/05/2021   CO2 22 06/05/2021   TSH 2.690 06/05/2021   PSA 0.95 03/08/2009   HGBA1C 5.7 (H) 06/05/2020     Assessment & Plan:   Problem List Items Addressed This Visit       Cardiovascular and Mediastinum   Essential hypertension - Primary    Stable currently without pharmacotherapy.  Advised to continue to monitor his blood pressures closely.      Relevant Orders   CMP14+EGFR     Other   HLD (hyperlipidemia)    Currently untreated.  We will recheck lipid panel today.      Relevant Orders   Lipid panel   Other Visit Diagnoses     Leukocytosis, unspecified type       Relevant Orders   CBC   Screening PSA (prostate specific antigen)       Relevant Orders   PSA       Follow-up:  Return in about 6 months (around 07/15/2022).  [place]

## 2022-01-12 NOTE — Assessment & Plan Note (Signed)
Stable currently without pharmacotherapy.  Advised to continue to monitor his blood pressures closely.

## 2022-01-12 NOTE — Patient Instructions (Signed)
Labs today.  If your BP stays persistently >130/80 please let me know as you will likely need medication.  Follow up in 6 months.  Take care  Dr. Adriana Simas

## 2022-01-12 NOTE — Assessment & Plan Note (Signed)
Currently untreated.  We will recheck lipid panel today.

## 2022-01-13 LAB — CBC
Hematocrit: 44.4 % (ref 37.5–51.0)
Hemoglobin: 15.2 g/dL (ref 13.0–17.7)
MCH: 30.3 pg (ref 26.6–33.0)
MCHC: 34.2 g/dL (ref 31.5–35.7)
MCV: 89 fL (ref 79–97)
Platelets: 199 10*3/uL (ref 150–450)
RBC: 5.01 x10E6/uL (ref 4.14–5.80)
RDW: 12.3 % (ref 11.6–15.4)
WBC: 9.9 10*3/uL (ref 3.4–10.8)

## 2022-01-13 LAB — CMP14+EGFR
ALT: 26 IU/L (ref 0–44)
AST: 19 IU/L (ref 0–40)
Albumin/Globulin Ratio: 1.7 (ref 1.2–2.2)
Albumin: 4.5 g/dL (ref 3.8–4.9)
Alkaline Phosphatase: 79 IU/L (ref 44–121)
BUN/Creatinine Ratio: 10 (ref 9–20)
BUN: 10 mg/dL (ref 6–24)
Bilirubin Total: 0.7 mg/dL (ref 0.0–1.2)
CO2: 23 mmol/L (ref 20–29)
Calcium: 9.4 mg/dL (ref 8.7–10.2)
Chloride: 104 mmol/L (ref 96–106)
Creatinine, Ser: 0.97 mg/dL (ref 0.76–1.27)
Globulin, Total: 2.6 g/dL (ref 1.5–4.5)
Glucose: 97 mg/dL (ref 70–99)
Potassium: 4.3 mmol/L (ref 3.5–5.2)
Sodium: 140 mmol/L (ref 134–144)
Total Protein: 7.1 g/dL (ref 6.0–8.5)
eGFR: 92 mL/min/{1.73_m2} (ref >59)

## 2022-01-13 LAB — LIPID PANEL
Chol/HDL Ratio: 6.6 ratio — ABNORMAL HIGH (ref 0.0–5.0)
Cholesterol, Total: 172 mg/dL (ref 100–199)
HDL: 26 mg/dL — ABNORMAL LOW (ref >39)
LDL Chol Calc (NIH): 103 mg/dL — ABNORMAL HIGH (ref 0–99)
Triglycerides: 247 mg/dL — ABNORMAL HIGH (ref 0–149)
VLDL Cholesterol Cal: 43 mg/dL — ABNORMAL HIGH (ref 5–40)

## 2022-01-13 LAB — PSA: Prostate Specific Ag, Serum: 2.9 ng/mL (ref 0.0–4.0)

## 2022-01-15 ENCOUNTER — Other Ambulatory Visit: Payer: Self-pay | Admitting: Family Medicine

## 2022-01-15 MED ORDER — ROSUVASTATIN CALCIUM 10 MG PO TABS: 10.0000 mg | ORAL_TABLET | Freq: Every day | ORAL | 3 refills | Status: DC

## 2022-06-08 ENCOUNTER — Encounter: Payer: 59 | Admitting: Internal Medicine

## 2022-07-15 ENCOUNTER — Ambulatory Visit: Payer: Managed Care, Other (non HMO) | Admitting: Family Medicine

## 2022-07-15 DIAGNOSIS — I10 Essential (primary) hypertension: Secondary | ICD-10-CM | POA: Diagnosis not present

## 2022-07-15 DIAGNOSIS — Z1211 Encounter for screening for malignant neoplasm of colon: Secondary | ICD-10-CM

## 2022-07-15 DIAGNOSIS — E782 Mixed hyperlipidemia: Secondary | ICD-10-CM

## 2022-07-15 MED ORDER — ROSUVASTATIN CALCIUM 10 MG PO TABS: 10.0000 mg | ORAL_TABLET | Freq: Every day | ORAL | 3 refills | Status: DC

## 2022-07-15 NOTE — Progress Notes (Signed)
Subjective:  Patient ID: Charles Holloway, male    DOB: 02-13-1966  Age: 57 y.o. MRN: [id number]  CC: Chief Complaint  Patient presents with   Follow-up    6 mouth for hypertension    HPI:  57 year old male with the below mentioned medical problems presents for follow-up.  Blood pressure remained stable without the use of pharmacotherapy.  He is feeling well at this time.  No chest pain or shortness of breath.  Patient compliant with Crestor.  No reported side effects.  Patient has never had a colonoscopy.  He is amenable to Cologuard.  He declines colonoscopy.  Patient Active Problem List   Diagnosis Date Noted   HLD (hyperlipidemia) 01/12/2022   OSA (obstructive sleep apnea) 05/11/2006   Essential hypertension 05/11/2006    Social Hx   Social History   Socioeconomic History   Marital status: Married    Spouse name: Phillyis    Number of children: 2   Years of education: Not on file   Highest education level: Some college, no degree  Occupational History   Not on file  Tobacco Use   Smoking status: Former   Smokeless tobacco: Current    Types: Chew  Substance and Sexual Activity   Alcohol use: Yes    Alcohol/week: 1.0 - 2.0 standard drink of alcohol    Types: 1 - 2 Cans of beer per week    Comment: weekly   Drug use: No   Sexual activity: Yes    Birth control/protection: Surgical  Other Topics Concern   Not on file  Social History Narrative   Lives with wife of 27 years in Oct   2 sons: one is in [place], [place] and the other is at home with him with [place]      Dogs: pup, gina, charlie   Cat: chet       Enjoy: working, outside things      Diet: eats all food groups    Caffeine:  Limited    Water: 6-10 cups daily       Wears seat belt    Does not use phone while driving    Oceanographer at home, Multimedia programmer location     Social Determinants of Health   Financial Resource Strain: Low Risk  (06/05/2020)   Overall Financial  Resource Strain (CARDIA)    Difficulty of Paying Living Expenses: Not hard at all  Food Insecurity: No Food Insecurity (06/05/2020)   Hunger Vital Sign    Worried About Running Out of Food in the Last Year: Never true    [place] in the Last Year: Never true  Transportation Needs: No Transportation Needs (06/05/2020)   PRAPARE - Hydrologist (Medical): No    Lack of Transportation (Non-Medical): No  Physical Activity: Sufficiently Active (06/05/2020)   Exercise Vital Sign    Days of Exercise per Week: 5 days    Minutes of Exercise per Session: 120 min  Stress: No Stress Concern Present (06/05/2020)   [place]    Feeling of Stress : Not at all  Social Connections: [place] (06/05/2020)   Social Connection and Isolation Panel [NHANES]    Frequency of Communication with Friends and Family: More than three times a week    Frequency of Social Gatherings with Friends and Family: Twice a week    Attends Religious Services: More than 4 times per  year    Active Member of Clubs or Organizations: Yes    Attends Archivist Meetings: More than 4 times per year    Marital Status: Married    Review of Systems Per HPI  Objective:  BP 130/84   Pulse 70   Temp 98.1 F (36.7 C) (Oral)   Ht 5\' 10"  (1.778 m)   Wt 219 lb 6.4 oz (99.5 kg)   SpO2 99%   BMI 31.48 kg/m      07/15/2022    8:32 AM 01/12/2022    8:32 AM 01/12/2022    8:19 AM  BP/Weight  Systolic BP 474 259 563  Diastolic BP 84 72 88  Wt. (Lbs) 219.4  213.2  BMI 31.48 kg/m2  30.59 kg/m2    Physical Exam Vitals and nursing note reviewed.  Constitutional:      General: He is not in acute distress.    Appearance: Normal appearance.  HENT:     Head: Normocephalic and atraumatic.     Mouth/Throat:     Pharynx: Oropharynx is clear.  Eyes:     General:        Right eye: No discharge.        Left eye: No  discharge.     Pupils: Pupils are equal, round, and reactive to light.  Cardiovascular:     Rate and Rhythm: Normal rate and regular rhythm.  Pulmonary:     Effort: Pulmonary effort is normal.     Breath sounds: Normal breath sounds. No wheezing or rales.  Neurological:     Mental Status: He is alert.  Psychiatric:        Mood and Affect: Mood normal.        Behavior: Behavior normal.     Lab Results  Component Value Date   WBC 9.9 01/12/2022   HGB 15.2 01/12/2022   HCT 44.4 01/12/2022   PLT 199 01/12/2022   GLUCOSE 97 01/12/2022   CHOL 172 01/12/2022   TRIG 247 (H) 01/12/2022   HDL 26 (L) 01/12/2022   LDLCALC 103 (H) 01/12/2022   ALT 26 01/12/2022   AST 19 01/12/2022   NA 140 01/12/2022   K 4.3 01/12/2022   CL 104 01/12/2022   CREATININE 0.97 01/12/2022   BUN 10 01/12/2022   CO2 23 01/12/2022   TSH 2.690 06/05/2021   PSA 0.95 03/08/2009   HGBA1C 5.7 (H) 06/05/2020     Assessment & Plan:   Problem List Items Addressed This Visit       Cardiovascular and Mediastinum   Essential hypertension    Stable without the use of medication.  Will continue to monitor closely.  If BP rises to 140/90 or greater, will proceed with pharmacotherapy.      Relevant Medications   rosuvastatin (CRESTOR) 10 MG tablet     Other   HLD (hyperlipidemia)    Tolerating Crestor.  Will continue.  Will obtain lipid panel at next visit.      Relevant Medications   rosuvastatin (CRESTOR) 10 MG tablet   Other Visit Diagnoses     Colon cancer screening       Relevant Orders   Cologuard       Meds ordered this encounter  Medications   rosuvastatin (CRESTOR) 10 MG tablet    Sig: Take 1 tablet (10 mg total) by mouth daily.    Dispense:  90 tablet    Refill:  3    Follow-up:  6 months  Shelly Spenser Lacinda Axon  DO [place]

## 2022-07-15 NOTE — Assessment & Plan Note (Addendum)
Stable without the use of medication.  Will continue to monitor closely.  If BP rises to 140/90 or greater, will proceed with pharmacotherapy.

## 2022-07-15 NOTE — Patient Instructions (Signed)
Continue your medication.  Follow-up in 6 months  Take care  Dr. Kristeen Lantz  

## 2022-07-15 NOTE — Assessment & Plan Note (Signed)
Tolerating Crestor.  Will continue.  Will obtain lipid panel at next visit.

## 2022-08-07 LAB — COLOGUARD: COLOGUARD: NEGATIVE

## 2023-01-13 ENCOUNTER — Ambulatory Visit: Payer: Managed Care, Other (non HMO) | Admitting: Family Medicine

## 2023-01-13 VITALS — BP 140/88 | HR 68 | Temp 97.5°F | Ht 70.0 in | Wt 210.0 lb

## 2023-01-13 DIAGNOSIS — Z130 Encounter for screening for diseases of the blood and blood-forming organs and certain disorders involving the immune mechanism: Secondary | ICD-10-CM

## 2023-01-13 DIAGNOSIS — E782 Mixed hyperlipidemia: Secondary | ICD-10-CM | POA: Diagnosis not present

## 2023-01-13 DIAGNOSIS — R079 Chest pain, unspecified: Secondary | ICD-10-CM | POA: Diagnosis not present

## 2023-01-13 DIAGNOSIS — Z125 Encounter for screening for malignant neoplasm of prostate: Secondary | ICD-10-CM

## 2023-01-13 DIAGNOSIS — I10 Essential (primary) hypertension: Secondary | ICD-10-CM

## 2023-01-13 NOTE — Progress Notes (Signed)
Subjective:  Patient ID: Charles Holloway, male    DOB: Oct 07, 1965  Age: 57 y.o. MRN: [id number]  CC:  Follow up   HPI:  57 year old male with HTN, OSA, HLD presents for follow up.  Did not tolerate Crestor. Had chest pain (muscular) several times while taking the medication.  He noticed that when he was out of town and did not take his medication his pain subsided and when he was back taking it it recurred.  He did this several times.  He therefore has discontinued.  BP 140/88 here today.  He is not on any pharmacotherapy.  Will discuss today.   Patient Active Problem List   Diagnosis Date Noted   Chest pain 01/13/2023   HLD (hyperlipidemia) 01/12/2022   OSA (obstructive sleep apnea) 05/11/2006   Essential hypertension 05/11/2006    Social Hx   Social History   Socioeconomic History   Marital status: Married    Spouse name: Phillyis    Number of children: 2   Years of education: Not on file   Highest education level: Some college, no degree  Occupational History   Not on file  Tobacco Use   Smoking status: Former   Smokeless tobacco: Current    Types: Chew  Substance and Sexual Activity   Alcohol use: Yes    Alcohol/week: 1.0 - 2.0 standard drink of alcohol    Types: 1 - 2 Cans of beer per week    Comment: weekly   Drug use: No   Sexual activity: Yes    Birth control/protection: Surgical  Other Topics Concern   Not on file  Social History Narrative   Lives with wife of 31 years in Oct   2 sons: one is in [place], [place] and the other is at home with him with [place]      Dogs: pup, gina, charlie   Cat: chet       Enjoy: working, outside things      Diet: eats all food groups    Caffeine:  Limited    Water: 6-10 cups daily       Wears seat belt    Does not use phone while driving    Psychologist, sport and exercise at home, Garment/textile technologist location     Social Determinants of Health   Financial Resource Strain: Low Risk  (06/05/2020)   Overall  Financial Resource Strain (CARDIA)    Difficulty of Paying Living Expenses: Not hard at all  Food Insecurity: No Food Insecurity (06/05/2020)   Hunger Vital Sign    Worried About Running Out of Food in the Last Year: Never true    Ran Out of Food in the Last Year: Never true  Transportation Needs: No Transportation Needs (06/05/2020)   PRAPARE - Administrator, Civil Service (Medical): No    Lack of Transportation (Non-Medical): No  Physical Activity: Sufficiently Active (06/05/2020)   Exercise Vital Sign    Days of Exercise per Week: 5 days    Minutes of Exercise per Session: 120 min  Stress: No Stress Concern Present (06/05/2020)   [employer] of Occupational Health - Occupational Stress Questionnaire    Feeling of Stress : Not at all  Social Connections: Socially Integrated (06/05/2020)   Social Connection and Isolation Panel [NHANES]    Frequency of Communication with Friends and Family: More than three times a week    Frequency of Social Gatherings with Friends and Family: Twice a week  Attends Religious Services: More than 4 times per year    Active Member of Clubs or Organizations: Yes    Attends Banker Meetings: More than 4 times per year    Marital Status: Married    Review of Systems  Constitutional: Negative.   Respiratory: Negative.      Objective:  BP (!) 140/88   Pulse 68   Temp (!) 97.5 F (36.4 C)   Ht 5\' 10"  (1.778 m)   Wt 210 lb (95.3 kg)   SpO2 98%   BMI 30.13 kg/m      01/13/2023    8:27 AM 07/15/2022    8:32 AM 01/12/2022    8:32 AM  BP/Weight  Systolic BP 140 130 138  Diastolic BP 88 84 72  Wt. (Lbs) 210 219.4   BMI 30.13 kg/m2 31.48 kg/m2     Physical Exam Vitals reviewed.  Constitutional:      Appearance: Normal appearance.  HENT:     Head: Normocephalic and atraumatic.  Eyes:     General:        Right eye: No discharge.        Left eye: No discharge.     Conjunctiva/sclera: Conjunctivae normal.   Cardiovascular:     Rate and Rhythm: Normal rate and regular rhythm.  Pulmonary:     Effort: Pulmonary effort is normal.     Breath sounds: Normal breath sounds. No wheezing, rhonchi or rales.  Musculoskeletal:     Cervical back: Neck supple.  Neurological:     Mental Status: He is alert.  Psychiatric:        Mood and Affect: Mood normal.        Behavior: Behavior normal.     Lab Results  Component Value Date   WBC 9.9 01/12/2022   HGB 15.2 01/12/2022   HCT 44.4 01/12/2022   PLT 199 01/12/2022   GLUCOSE 97 01/12/2022   CHOL 172 01/12/2022   TRIG 247 (H) 01/12/2022   HDL 26 (L) 01/12/2022   LDLCALC 103 (H) 01/12/2022   ALT 26 01/12/2022   AST 19 01/12/2022   NA 140 01/12/2022   K 4.3 01/12/2022   CL 104 01/12/2022   CREATININE 0.97 01/12/2022   BUN 10 01/12/2022   CO2 23 01/12/2022   TSH 2.690 06/05/2021   PSA 0.95 03/08/2009   HGBA1C 5.7 (H) 06/05/2020   EKG: Interpretation -normal sinus rhythm with a rate of 63.  Normal axis.  LVH.  No ST or T wave abnormality.  Assessment & Plan:   Problem List Items Addressed This Visit       Cardiovascular and Mediastinum   Essential hypertension - Primary    BP elevated at 140/88. Labs today.  No pharmacotherapy at this time.  We did discuss pharmacotherapy.  Patient wishes to continue to monitor closely.  Pressures need to be below 140/90 consistently.  Preferably less than 130/80.      Relevant Orders   CMP14+EGFR     Other   Chest pain    EKG with LVH.  No ST or T wave abnormalities.  Presumably this is from medication side effect.      Relevant Orders   EKG 12-Lead (Completed)   HLD (hyperlipidemia)    Patient did not tolerate Crestor.  Discontinuing today.  EKG was obtained today given his reports that he experienced chest pain associated with Crestor. Rechecking lipids today.      Relevant Orders   Lipid panel  Other Visit Diagnoses     Prostate cancer screening       Relevant Orders   PSA    Screening for deficiency anemia       Relevant Orders   CBC       Follow-up:  Return for HTN follow up.  Everlene Other DO [facility]

## 2023-01-13 NOTE — Assessment & Plan Note (Signed)
BP elevated at 140/88. Labs today.  No pharmacotherapy at this time.  We did discuss pharmacotherapy.  Patient wishes to continue to monitor closely.  Pressures need to be below 140/90 consistently.  Preferably less than 130/80.

## 2023-01-13 NOTE — Assessment & Plan Note (Addendum)
Patient did not tolerate Crestor.  Discontinuing today.  EKG was obtained today given his reports that he experienced chest pain associated with Crestor. Rechecking lipids today.

## 2023-01-13 NOTE — Assessment & Plan Note (Signed)
EKG with LVH.  No ST or T wave abnormalities.  Presumably this is from medication side effect.

## 2023-01-13 NOTE — Patient Instructions (Signed)
Labs today.  Monitor BP closely.  Follow up in 3-6 months.

## 2023-01-14 LAB — LIPID PANEL
Chol/HDL Ratio: 6.2 ratio — ABNORMAL HIGH (ref 0.0–5.0)
Cholesterol, Total: 205 mg/dL — ABNORMAL HIGH (ref 100–199)
HDL: 33 mg/dL — ABNORMAL LOW (ref >39)
LDL Chol Calc (NIH): 136 mg/dL — ABNORMAL HIGH (ref 0–99)
Triglycerides: 197 mg/dL — ABNORMAL HIGH (ref 0–149)
VLDL Cholesterol Cal: 36 mg/dL (ref 5–40)

## 2023-01-14 LAB — CMP14+EGFR
ALT: 34 IU/L (ref 0–44)
AST: 31 IU/L (ref 0–40)
Albumin: 4.8 g/dL (ref 3.8–4.9)
Alkaline Phosphatase: 68 IU/L (ref 44–121)
BUN/Creatinine Ratio: 12 (ref 9–20)
Bilirubin Total: 0.8 mg/dL (ref 0.0–1.2)
CO2: 24 mmol/L (ref 20–29)
Calcium: 9.9 mg/dL (ref 8.7–10.2)
Creatinine, Ser: 1.06 mg/dL (ref 0.76–1.27)
Globulin, Total: 2.8 g/dL (ref 1.5–4.5)
Glucose: 94 mg/dL (ref 70–99)
Potassium: 4.5 mmol/L (ref 3.5–5.2)
Sodium: 141 mmol/L (ref 134–144)
Total Protein: 7.6 g/dL (ref 6.0–8.5)
eGFR: 82 mL/min/{1.73_m2} (ref >59)

## 2023-01-14 LAB — CBC
Hematocrit: 45.9 % (ref 37.5–51.0)
MCH: 30.1 pg (ref 26.6–33.0)
MCHC: 33.8 g/dL (ref 31.5–35.7)
MCV: 89 fL (ref 79–97)
Platelets: 201 10*3/uL (ref 150–450)
RBC: 5.15 x10E6/uL (ref 4.14–5.80)
RDW: 13.1 % (ref 11.6–15.4)
WBC: 8.4 10*3/uL (ref 3.4–10.8)

## 2023-01-14 LAB — PSA: Prostate Specific Ag, Serum: 2.6 ng/mL (ref 0.0–4.0)

## 2023-07-16 ENCOUNTER — Ambulatory Visit (INDEPENDENT_AMBULATORY_CARE_PROVIDER_SITE_OTHER): Payer: Managed Care, Other (non HMO) | Admitting: Family Medicine

## 2023-07-16 VITALS — BP 164/72 | HR 68 | Temp 98.1°F | Ht 70.0 in | Wt 228.2 lb

## 2023-07-16 DIAGNOSIS — Z130 Encounter for screening for diseases of the blood and blood-forming organs and certain disorders involving the immune mechanism: Secondary | ICD-10-CM

## 2023-07-16 DIAGNOSIS — E782 Mixed hyperlipidemia: Secondary | ICD-10-CM | POA: Diagnosis not present

## 2023-07-16 DIAGNOSIS — I10 Essential (primary) hypertension: Secondary | ICD-10-CM

## 2023-07-16 MED ORDER — LOSARTAN POTASSIUM 50 MG PO TABS: 50.0000 mg | ORAL_TABLET | Freq: Every day | ORAL | 3 refills | Status: DC

## 2023-07-16 NOTE — Patient Instructions (Signed)
Lab in 2 weeks  Medication as prescribed.  Follow up in 3 months.

## 2023-07-18 NOTE — Assessment & Plan Note (Signed)
Uncontrolled/worsening.  Starting on losartan.  Labs in 2 weeks.

## 2023-07-18 NOTE — Assessment & Plan Note (Signed)
Lipid panel to assess.  Currently on no pharmacotherapy.  He stopped taking Crestor.

## 2023-07-18 NOTE — Progress Notes (Signed)
Subjective:  Patient ID: Charles Holloway, male    DOB: 1966-04-29  Age: 58 y.o. MRN: [id number]  CC: Follow-up   HPI:  58 year old male with OSA and hyperlipidemia presents for follow-up.  Blood pressure markedly elevated today and on repeat.  Patient has had elevated blood pressures in the past.  Will discuss pharmacotherapy today.  Patient declines flu vaccine.  Patient not taking Crestor regarding hyperlipidemia.  Feeling well.  Denies chest pain or shortness of breath.  Patient Active Problem List   Diagnosis Date Noted   HLD (hyperlipidemia) 01/12/2022   OSA (obstructive sleep apnea) 05/11/2006   Essential hypertension 05/11/2006    Social Hx   Social History   Socioeconomic History   Marital status: Married    Spouse name: Phillyis    Number of children: 2   Years of education: Not on file   Highest education level: Some college, no degree  Occupational History   Not on file  Tobacco Use   Smoking status: Former   Smokeless tobacco: Current    Types: Chew  Substance and Sexual Activity   Alcohol use: Yes    Alcohol/week: 1.0 - 2.0 standard drink of alcohol    Types: 1 - 2 Cans of beer per week    Comment: weekly   Drug use: No   Sexual activity: Yes    Birth control/protection: Surgical  Other Topics Concern   Not on file  Social History Narrative   Lives with wife of 31 years in Oct   2 sons: one is in [place], [place] and the other is at home with him with [place]      Dogs: pup, gina, charlie   Cat: chet       Enjoy: working, outside things      Diet: eats all food groups    Caffeine:  Limited    Water: 6-10 cups daily       Wears seat belt    Does not use phone while driving    Psychologist, sport and exercise at home, Garment/textile technologist location     Social Drivers of Health   Financial Resource Strain: Low Risk  (06/05/2020)   Overall Financial Resource Strain (CARDIA)    Difficulty of Paying Living Expenses: Not hard at all  Food  Insecurity: No Food Insecurity (06/05/2020)   Hunger Vital Sign    Worried About Running Out of Food in the Last Year: Never true    Ran Out of Food in the Last Year: Never true  Transportation Needs: No Transportation Needs (06/05/2020)   PRAPARE - Administrator, Civil Service (Medical): No    Lack of Transportation (Non-Medical): No  Physical Activity: Sufficiently Active (06/05/2020)   Exercise Vital Sign    Days of Exercise per Week: 5 days    Minutes of Exercise per Session: 120 min  Stress: No Stress Concern Present (06/05/2020)   [employer] of Occupational Health - Occupational Stress Questionnaire    Feeling of Stress : Not at all  Social Connections: Socially Integrated (06/05/2020)   Social Connection and Isolation Panel [NHANES]    Frequency of Communication with Friends and Family: More than three times a week    Frequency of Social Gatherings with Friends and Family: Twice a week    Attends Religious Services: More than 4 times per year    Active Member of [employer] or Organizations: Yes    Attends Banker Meetings: More than 4 times per  year    Marital Status: Married    Review of Systems Per HPI  Objective:  BP (!) 164/72   Pulse 68   Temp 98.1 F (36.7 C)   Ht 5\' 10"  (1.778 m)   Wt 228 lb 3.2 oz (103.5 kg)   SpO2 99%   BMI 32.74 kg/m      07/16/2023    9:06 AM 07/16/2023    8:56 AM 07/16/2023    8:44 AM  BP/Weight  Systolic BP 164 160 178  Diastolic BP 72 84 75  Wt. (Lbs)   228.2  BMI   32.74 kg/m2    Physical Exam Vitals and nursing note reviewed.  Constitutional:      General: He is not in acute distress.    Appearance: Normal appearance.  HENT:     Head: Normocephalic and atraumatic.  Eyes:     General:        Right eye: No discharge.        Left eye: No discharge.     Conjunctiva/sclera: Conjunctivae normal.  Cardiovascular:     Rate and Rhythm: Normal rate and regular rhythm.  Pulmonary:     Effort:  Pulmonary effort is normal.     Breath sounds: Normal breath sounds. No wheezing, rhonchi or rales.  Neurological:     Mental Status: He is alert.  Psychiatric:        Mood and Affect: Mood normal.        Behavior: Behavior normal.     Lab Results  Component Value Date   WBC 8.4 01/13/2023   HGB 15.5 01/13/2023   HCT 45.9 01/13/2023   PLT 201 01/13/2023   GLUCOSE 94 01/13/2023   CHOL 205 (H) 01/13/2023   TRIG 197 (H) 01/13/2023   HDL 33 (L) 01/13/2023   LDLCALC 136 (H) 01/13/2023   ALT 34 01/13/2023   AST 31 01/13/2023   NA 141 01/13/2023   K 4.5 01/13/2023   CL 102 01/13/2023   CREATININE 1.06 01/13/2023   BUN 13 01/13/2023   CO2 24 01/13/2023   TSH 2.690 06/05/2021   PSA 0.95 03/08/2009   HGBA1C 5.7 (H) 06/05/2020     Assessment & Plan:   Problem List Items Addressed This Visit       Cardiovascular and Mediastinum   Essential hypertension - Primary   Uncontrolled/worsening.  Starting on losartan.  Labs in 2 weeks.      Relevant Medications   losartan (COZAAR) 50 MG tablet   Other Relevant Orders   CMP14+EGFR   Microalbumin / creatinine urine ratio     Other   HLD (hyperlipidemia)   Lipid panel to assess.  Currently on no pharmacotherapy.  He stopped taking Crestor.      Relevant Medications   losartan (COZAAR) 50 MG tablet   Other Relevant Orders   Lipid panel   Other Visit Diagnoses       Screening for deficiency anemia       Relevant Orders   CBC       Meds ordered this encounter  Medications   losartan (COZAAR) 50 MG tablet    Sig: Take 1 tablet (50 mg total) by mouth daily.    Dispense:  90 tablet    Refill:  3    Follow-up:  Return in about 3 months (around 10/14/2023).  Everlene Other DO [facility]

## 2023-08-26 LAB — CMP14+EGFR
ALT: 42 IU/L (ref 0–44)
AST: 29 IU/L (ref 0–40)
Albumin: 4.4 g/dL (ref 3.8–4.9)
Alkaline Phosphatase: 69 IU/L (ref 44–121)
BUN/Creatinine Ratio: 9 (ref 9–20)
BUN: 9 mg/dL (ref 6–24)
Bilirubin Total: 0.6 mg/dL (ref 0.0–1.2)
CO2: 23 mmol/L (ref 20–29)
Calcium: 9.4 mg/dL (ref 8.7–10.2)
Chloride: 105 mmol/L (ref 96–106)
Creatinine, Ser: 1.03 mg/dL (ref 0.76–1.27)
Globulin, Total: 2.5 g/dL (ref 1.5–4.5)
Glucose: 90 mg/dL (ref 70–99)
Potassium: 4.7 mmol/L (ref 3.5–5.2)
Sodium: 142 mmol/L (ref 134–144)
Total Protein: 6.9 g/dL (ref 6.0–8.5)
eGFR: 85 mL/min/{1.73_m2} (ref >59)

## 2023-08-26 LAB — CBC
Hematocrit: 42.6 % (ref 37.5–51.0)
Hemoglobin: 14.8 g/dL (ref 13.0–17.7)
MCH: 31.3 pg (ref 26.6–33.0)
MCHC: 34.7 g/dL (ref 31.5–35.7)
MCV: 90 fL (ref 79–97)
Platelets: 228 10*3/uL (ref 150–450)
RBC: 4.73 x10E6/uL (ref 4.14–5.80)
RDW: 12.6 % (ref 11.6–15.4)
WBC: 6.9 10*3/uL (ref 3.4–10.8)

## 2023-08-26 LAB — LIPID PANEL
Chol/HDL Ratio: 6.4 ratio — ABNORMAL HIGH (ref 0.0–5.0)
Cholesterol, Total: 172 mg/dL (ref 100–199)
HDL: 27 mg/dL — ABNORMAL LOW (ref >39)
LDL Chol Calc (NIH): 103 mg/dL — ABNORMAL HIGH (ref 0–99)
Triglycerides: 244 mg/dL — ABNORMAL HIGH (ref 0–149)
VLDL Cholesterol Cal: 42 mg/dL — ABNORMAL HIGH (ref 5–40)

## 2023-08-26 LAB — MICROALBUMIN / CREATININE URINE RATIO
Creatinine, Urine: 107.8 mg/dL
Microalb/Creat Ratio: 3 mg/g{creat} (ref 0–29)
Microalbumin, Urine: 3.1 ug/mL

## 2023-08-30 ENCOUNTER — Encounter: Payer: Self-pay | Admitting: Family Medicine

## 2023-10-14 ENCOUNTER — Ambulatory Visit: Payer: Managed Care, Other (non HMO) | Admitting: Family Medicine

## 2023-10-14 VITALS — BP 146/87 | HR 63 | Temp 97.9°F | Ht 70.0 in | Wt 221.0 lb

## 2023-10-14 DIAGNOSIS — I10 Essential (primary) hypertension: Secondary | ICD-10-CM

## 2023-10-14 MED ORDER — LOSARTAN POTASSIUM 100 MG PO TABS: 100.0000 mg | ORAL_TABLET | Freq: Every day | ORAL | 3 refills | Status: DC

## 2023-10-14 NOTE — Progress Notes (Signed)
 Subjective:  Patient ID: Charles Holloway, male    DOB: July 02, 1965  Age: 58 y.o. MRN: [id number]  CC:   Chief Complaint  Patient presents with   Hypertension    HPI:  58 year old male presents for follow up regarding HTN.  Compliant with losartan .  No adverse side effects.  BP mildly elevated here today.  Denies chest pain or shortness of breath.  Feeling well.  Patient Active Problem List   Diagnosis Date Noted   HLD (hyperlipidemia) 01/12/2022   OSA (obstructive sleep apnea) 05/11/2006   Essential hypertension 05/11/2006    Social Hx   Social History   Socioeconomic History   Marital status: Married    Spouse name: Phillyis    Number of children: 2   Years of education: Not on file   Highest education level: Some college, no degree  Occupational History   Not on file  Tobacco Use   Smoking status: Former   Smokeless tobacco: Current    Types: Chew  Substance and Sexual Activity   Alcohol use: Yes    Alcohol/week: 1.0 - 2.0 standard drink of alcohol    Types: 1 - 2 Cans of beer per week    Comment: weekly   Drug use: No   Sexual activity: Yes    Birth control/protection: Surgical  Other Topics Concern   Not on file  Social History Narrative   Lives with wife of 31 years in Oct   2 sons: one is in [place], [place] and the other is at home with him with [place]      Dogs: pup, gina, charlie   Cat: chet       Enjoy: working, outside things      Diet: eats all food groups    Caffeine:  Limited    Water: 6-10 cups daily       Wears seat belt    Does not use phone while driving    Psychologist, sport and exercise at home, Garment/textile technologist location     Social Drivers of Health   Financial Resource Strain: Low Risk  (06/05/2020)   Overall Financial Resource Strain (CARDIA)    Difficulty of Paying Living Expenses: Not hard at all  Food Insecurity: No Food Insecurity (06/05/2020)   Hunger Vital Sign    Worried About Running Out of Food in the Last Year:  Never true    Ran Out of Food in the Last Year: Never true  Transportation Needs: No Transportation Needs (06/05/2020)   PRAPARE - Administrator, Civil Service (Medical): No    Lack of Transportation (Non-Medical): No  Physical Activity: Sufficiently Active (06/05/2020)   Exercise Vital Sign    Days of Exercise per Week: 5 days    Minutes of Exercise per Session: 120 min  Stress: No Stress Concern Present (06/05/2020)   [employer] of Occupational Health - Occupational Stress Questionnaire    Feeling of Stress : Not at all  Social Connections: Socially Integrated (06/05/2020)   Social Connection and Isolation Panel [NHANES]    Frequency of Communication with Friends and Family: More than three times a week    Frequency of Social Gatherings with Friends and Family: Twice a week    Attends Religious Services: More than 4 times per year    Active Member of [employer] or Organizations: Yes    Attends Engineer, structural: More than 4 times per year    Marital Status: Married  Review of Systems Per HPI  Objective:  BP (!) 146/87   Pulse 63   Temp 97.9 F (36.6 C)   Ht 5\' 10"  (1.778 m)   Wt 221 lb (100.2 kg)   SpO2 97%   BMI 31.71 kg/m      10/14/2023    9:01 AM 10/14/2023    8:44 AM 07/16/2023    9:06 AM  BP/Weight  Systolic BP 146 160 164  Diastolic BP 87 85 72  Wt. (Lbs)  221   BMI  31.71 kg/m2     Physical Exam Vitals and nursing note reviewed.  Constitutional:      General: He is not in acute distress.    Appearance: Normal appearance.  HENT:     Head: Normocephalic and atraumatic.  Eyes:     General:        Right eye: No discharge.        Left eye: No discharge.     Conjunctiva/sclera: Conjunctivae normal.  Cardiovascular:     Rate and Rhythm: Normal rate and regular rhythm.  Pulmonary:     Effort: Pulmonary effort is normal.     Breath sounds: Normal breath sounds. No wheezing, rhonchi or rales.  Neurological:     Mental Status:  He is alert.  Psychiatric:        Mood and Affect: Mood normal.        Behavior: Behavior normal.     Lab Results  Component Value Date   WBC 6.9 08/25/2023   HGB 14.8 08/25/2023   HCT 42.6 08/25/2023   PLT 228 08/25/2023   GLUCOSE 90 08/25/2023   CHOL 172 08/25/2023   TRIG 244 (H) 08/25/2023   HDL 27 (L) 08/25/2023   LDLCALC 103 (H) 08/25/2023   ALT 42 08/25/2023   AST 29 08/25/2023   NA 142 08/25/2023   K 4.7 08/25/2023   CL 105 08/25/2023   CREATININE 1.03 08/25/2023   BUN 9 08/25/2023   CO2 23 08/25/2023   TSH 2.690 06/05/2021   PSA 0.95 03/08/2009   HGBA1C 5.7 (H) 06/05/2020     Assessment & Plan:  Essential hypertension Assessment & Plan: Not at goal.  Increasing losartan .  Follow-up BMP in 2 weeks.  Follow-up with me in 3 months.  Orders: -     Losartan  Potassium; Take 1 tablet (100 mg total) by mouth daily.  Dispense: 90 tablet; Refill: 3 -     Basic metabolic panel with GFR    Follow-up:  3 months  Yue Flanigan Debrah Fan DO [facility]

## 2023-10-14 NOTE — Patient Instructions (Signed)
 Losartan  increased.  Lab in 2 weeks.  Follow up in 3 months.

## 2023-10-14 NOTE — Assessment & Plan Note (Signed)
 Not at goal.  Increasing losartan .  Follow-up BMP in 2 weeks.  Follow-up with me in 3 months.

## 2023-11-12 ENCOUNTER — Ambulatory Visit: Payer: Self-pay | Admitting: Family Medicine

## 2023-11-12 LAB — BASIC METABOLIC PANEL WITH GFR
BUN/Creatinine Ratio: 12 (ref 9–20)
BUN: 14 mg/dL (ref 6–24)
CO2: 20 mmol/L (ref 20–29)
Calcium: 9.4 mg/dL (ref 8.7–10.2)
Chloride: 103 mmol/L (ref 96–106)
Creatinine, Ser: 1.19 mg/dL (ref 0.76–1.27)
Glucose: 106 mg/dL — ABNORMAL HIGH (ref 70–99)
Potassium: 4.7 mmol/L (ref 3.5–5.2)
Sodium: 140 mmol/L (ref 134–144)
eGFR: 71 mL/min/{1.73_m2} (ref >59)

## 2024-01-14 ENCOUNTER — Ambulatory Visit: Admitting: Nurse Practitioner

## 2024-01-14 ENCOUNTER — Other Ambulatory Visit: Payer: Self-pay | Admitting: Family Medicine

## 2024-01-14 ENCOUNTER — Encounter: Payer: Self-pay | Admitting: Nurse Practitioner

## 2024-01-14 VITALS — BP 114/72 | HR 59 | Temp 97.9°F | Ht 70.0 in | Wt 215.0 lb

## 2024-01-14 DIAGNOSIS — I10 Essential (primary) hypertension: Secondary | ICD-10-CM

## 2024-01-15 ENCOUNTER — Encounter: Payer: Self-pay | Admitting: Nurse Practitioner

## 2024-01-15 NOTE — Progress Notes (Signed)
 Subjective:    Patient ID: Charles Holloway, male    DOB: Sep 28, 1965, 58 y.o.   MRN: [id number]  HPI Presents for routine follow up on HTN. Eats healthy diet with no added salt. Exposed to excessive heat in the summer. Tends to drop weight with drop in BP. Will take Losartan  100 mg 7-10 days then skip a dose because he begins to feel sluggish. BP outside office runs 112-116/60-70.  Gets regular eye exams.  Chews tobacco. A can will last about a week.    Review of Systems  Respiratory:  Negative for cough, chest tightness and shortness of breath.   Cardiovascular:  Negative for chest pain and leg swelling.  Genitourinary:  Enuresis: d7.  Neurological:  Negative for syncope, facial asymmetry, speech difficulty, weakness, numbness and headaches.       Minimal lightheadness at times especially in the heat.       01/14/2024    8:37 AM  Depression screen PHQ 2/9  Decreased Interest 0  Down, Depressed, Hopeless 0  PHQ - 2 Score 0  Altered sleeping 0  Tired, decreased energy 0  Change in appetite 0  Feeling bad or failure about yourself  0  Trouble concentrating 0  Moving slowly or fidgety/restless 0  Suicidal thoughts 0  PHQ-9 Score 0  Difficult doing work/chores Not difficult at all      01/14/2024    8:38 AM 10/14/2023    8:47 AM 07/16/2023    8:51 AM 01/13/2023   10:18 AM  GAD 7 : Generalized Anxiety Score  Nervous, Anxious, on Edge 0 0 0 0  Control/stop worrying 0 0 0 0  Worry too much - different things 0 0 0 0  Trouble relaxing 0 0 0 0  Restless 0 0 0 0  Easily annoyed or irritable 0 0 0 0  Afraid - awful might happen 0 0 0 0  Total GAD 7 Score 0 0 0 0  Anxiety Difficulty Not difficult at all Not difficult at all      Social History   Tobacco Use   Smoking status: Former   Smokeless tobacco: Current    Types: Chew  Substance Use Topics   Alcohol use: Yes    Alcohol/week: 1.0 - 2.0 standard drink of alcohol    Types: 1 - 2 Cans of beer per week    Comment:  weekly   Drug use: No        Objective:   Physical Exam Vitals and nursing note reviewed.  Constitutional:      General: He is not in acute distress. Neck:     Vascular: No carotid bruit.  Cardiovascular:     Rate and Rhythm: Normal rate and regular rhythm.     Heart sounds: Normal heart sounds.  Pulmonary:     Effort: Pulmonary effort is normal.     Breath sounds: Normal breath sounds.  Neurological:     Mental Status: He is alert.  Psychiatric:        Mood and Affect: Mood normal.        Behavior: Behavior normal.        Thought Content: Thought content normal.        Judgment: Judgment normal.    Today's Vitals   01/14/24 0836  BP: 114/72  Pulse: (!) 59  Temp: 97.9 F (36.6 C)  SpO2: 100%  Weight: 215 lb (97.5 kg)  Height: 5' 10 (1.778 m)   Body mass index is 30.85  kg/m.         Assessment & Plan:   Problem List Items Addressed This Visit       Cardiovascular and Mediastinum   Essential hypertension - Primary   Graph of his weight and BP shows reduction in the summer. Discussed importance of hydration and electrolyte replacement in the heat and be careful with sudden position change.  Reduce Losartan  dose to 50 mg daily in the summer and continue to monitor. Increase dose back to 100 mg this fall if BP goes back up.  Return in about 6 months (around 07/16/2024).

## 2024-04-03 ENCOUNTER — Telehealth: Payer: Self-pay | Admitting: Family Medicine

## 2024-04-03 ENCOUNTER — Other Ambulatory Visit (HOSPITAL_BASED_OUTPATIENT_CLINIC_OR_DEPARTMENT_OTHER): Payer: Self-pay

## 2024-04-03 MED ORDER — LOSARTAN POTASSIUM 50 MG PO TABS
50.0000 mg | ORAL_TABLET | Freq: Every day | ORAL | 3 refills | Status: DC
  Filled 2024-04-12: qty 90, 90d supply, fill #0

## 2024-04-03 NOTE — Telephone Encounter (Unsigned)
 Copied from CRM [id number]. Topic: Clinical - Medication Question >> Apr 03, 2024  3:57 PM Tiffini S wrote: Reason for CRM: Tammy with [facility] called stating that they had a prescription for losartan  (COZAAR ) 100 MG tablet in April and now  losartan  (COZAAR ) 50 MG tablet in July- need to speak with nurse for verbal consent of medication decrease  Please call at [phone]

## 2024-04-04 NOTE — Telephone Encounter (Signed)
 Cook, Jayce G, DO      04/04/24  2:17 PM Carolyn decreased. Okay with 50 mg dose

## 2024-04-04 NOTE — Telephone Encounter (Signed)
 Spoke with Tammy at the pharmacy to inform per drs notes regarding medication dose.

## 2024-04-12 ENCOUNTER — Other Ambulatory Visit (HOSPITAL_COMMUNITY): Payer: Self-pay

## 2024-04-12 ENCOUNTER — Other Ambulatory Visit (HOSPITAL_BASED_OUTPATIENT_CLINIC_OR_DEPARTMENT_OTHER): Payer: Self-pay

## 2024-04-13 ENCOUNTER — Other Ambulatory Visit (HOSPITAL_BASED_OUTPATIENT_CLINIC_OR_DEPARTMENT_OTHER): Payer: Self-pay

## 2024-06-27 ENCOUNTER — Ambulatory Visit: Admitting: Family Medicine

## 2024-07-05 ENCOUNTER — Other Ambulatory Visit (HOSPITAL_BASED_OUTPATIENT_CLINIC_OR_DEPARTMENT_OTHER): Payer: Self-pay

## 2024-07-05 ENCOUNTER — Ambulatory Visit: Admitting: Family Medicine

## 2024-07-05 ENCOUNTER — Encounter: Payer: Self-pay | Admitting: Family Medicine

## 2024-07-05 VITALS — BP 118/78 | HR 69 | Temp 97.9°F | Ht 70.0 in | Wt 215.0 lb

## 2024-07-05 DIAGNOSIS — I10 Essential (primary) hypertension: Secondary | ICD-10-CM

## 2024-07-05 DIAGNOSIS — Z125 Encounter for screening for malignant neoplasm of prostate: Secondary | ICD-10-CM | POA: Diagnosis not present

## 2024-07-05 DIAGNOSIS — E782 Mixed hyperlipidemia: Secondary | ICD-10-CM | POA: Diagnosis not present

## 2024-07-05 DIAGNOSIS — Z130 Encounter for screening for diseases of the blood and blood-forming organs and certain disorders involving the immune mechanism: Secondary | ICD-10-CM

## 2024-07-05 MED ORDER — LOSARTAN POTASSIUM 50 MG PO TABS
50.0000 mg | ORAL_TABLET | Freq: Every day | ORAL | 3 refills | Status: AC
  Filled 2024-07-05: qty 90, 90d supply, fill #0

## 2024-07-05 NOTE — Assessment & Plan Note (Signed)
 Stable. Continue losartan

## 2024-07-05 NOTE — Assessment & Plan Note (Signed)
 Not at goal.  Checking lipid panel today. Current ASCVD rescore is 10.2%.

## 2024-07-05 NOTE — Patient Instructions (Signed)
Labs today.  Follow up annually.  Take care  Dr. Raylen Tangonan  

## 2024-07-05 NOTE — Progress Notes (Signed)
 "  Subjective:  Patient ID: Charles Holloway, male    DOB: 1966-03-01  Age: 59 y.o. MRN: [id number]  CC:   Chief Complaint  Patient presents with   6 month follow up    HPI:  59 year old male presents for follow-up.  Hypertension well-controlled losartan .  Untreated hyperlipidemia.  Last LDL 103. The 10-year ASCVD risk score (Arnett DK, et al., 2019) is: 10.2%   Values used to calculate the score:     Age: 89 years     Clinically relevant sex: Male     Is Non-Hispanic African American: No     Diabetic: No     Tobacco smoker: No     Systolic Blood Pressure: 118 mmHg     Is BP treated: Yes     HDL Cholesterol: 27 mg/dL     Total Cholesterol: 172 mg/dL  Patient is due for immunizations but declines today.  Colon cancer screening up-to-date.  He is feeling well.  No chest pain or shortness of breath.  Good appetite.   Patient Active Problem List   Diagnosis Date Noted   HLD (hyperlipidemia) 01/12/2022   OSA (obstructive sleep apnea) 05/11/2006   Essential hypertension 05/11/2006    Social Hx   Social History   Socioeconomic History   Marital status: Married    Spouse name: Phillyis    Number of children: 2   Years of education: Not on file   Highest education level: Associate degree: occupational, scientist, product/process development, or vocational program  Occupational History   Not on file  Tobacco Use   Smoking status: Former   Smokeless tobacco: Current    Types: Chew  Substance and Sexual Activity   Alcohol use: Yes    Alcohol/week: 1.0 - 2.0 standard drink of alcohol    Types: 1 - 2 Cans of beer per week    Comment: weekly   Drug use: No   Sexual activity: Yes    Birth control/protection: Surgical  Other Topics Concern   Not on file  Social History Narrative   Lives with wife of 31 years in Oct   2 sons: one is in [place], [place] and the other is at home with him with grandbaby      Dogs: pup, gina, charlie   Cat: chet       Enjoy: working, outside things      Diet: eats all  food groups    Caffeine:  Limited    Water: 6-10 cups daily       Wears seat belt    Does not use phone while driving    Psychologist, sport and exercise at home, Garment/textile Technologist location     Social Drivers of Health   Tobacco Use: High Risk (07/05/2024)   Patient History    Smoking Tobacco Use: Former    Smokeless Tobacco Use: Current    Passive Exposure: Not on Actuary Strain: Low Risk (07/01/2024)   Overall Financial Resource Strain (CARDIA)    Difficulty of Paying Living Expenses: Not hard at all  Food Insecurity: No Food Insecurity (07/01/2024)   Epic    Worried About Radiation Protection Practitioner of Food in the Last Year: Never true    Ran Out of Food in the Last Year: Never true  Transportation Needs: No Transportation Needs (07/01/2024)   Epic    Lack of Transportation (Medical): No    Lack of Transportation (Non-Medical): No  Physical Activity: Unknown (07/01/2024)   Exercise Vital Sign  Days of Exercise per Week: 7 days    Minutes of Exercise per Session: Patient declined  Stress: No Stress Concern Present (07/01/2024)   [employer] of Occupational Health - Occupational Stress Questionnaire    Feeling of Stress: Not at all  Social Connections: Socially Integrated (07/01/2024)   Social Connection and Isolation Panel    Frequency of Communication with Friends and Family: More than three times a week    Frequency of Social Gatherings with Friends and Family: Three times a week    Attends Religious Services: More than 4 times per year    Active Member of Clubs or Organizations: Yes    Attends Banker Meetings: More than 4 times per year    Marital Status: Married  Depression (PHQ2-9): Low Risk (07/05/2024)   Depression (PHQ2-9)    PHQ-2 Score: 0  Alcohol Screen: Low Risk (07/01/2024)   Alcohol Screen    Last Alcohol Screening Score (AUDIT): 2  Housing: Low Risk (07/01/2024)   Epic    Unable to Pay for Housing in the Last Year: No    Number of Times  Moved in the Last Year: 0    Homeless in the Last Year: No  Utilities: Not on file  Health Literacy: Not on file    Review of Systems Per HPI  Objective:  BP 118/78   Pulse 69   Temp 97.9 F (36.6 C)   Ht 5' 10 (1.778 m)   Wt 215 lb (97.5 kg)   SpO2 97%   BMI 30.85 kg/m      07/05/2024    8:11 AM 01/14/2024    8:36 AM 10/14/2023    9:01 AM  BP/Weight  Systolic BP 118 114 146  Diastolic BP 78 72 87  Wt. (Lbs) 215 215   BMI 30.85 kg/m2 30.85 kg/m2     Physical Exam Vitals and nursing note reviewed.  Constitutional:      General: He is not in acute distress.    Appearance: Normal appearance.  HENT:     Head: Normocephalic and atraumatic.  Eyes:     General:        Right eye: No discharge.        Left eye: No discharge.     Conjunctiva/sclera: Conjunctivae normal.  Cardiovascular:     Rate and Rhythm: Normal rate and regular rhythm.  Pulmonary:     Effort: Pulmonary effort is normal.     Breath sounds: Normal breath sounds. No wheezing, rhonchi or rales.  Neurological:     Mental Status: He is alert.  Psychiatric:        Mood and Affect: Mood normal.        Behavior: Behavior normal.     Lab Results  Component Value Date   WBC 6.9 08/25/2023   HGB 14.8 08/25/2023   HCT 42.6 08/25/2023   PLT 228 08/25/2023   GLUCOSE 106 (H) 11/11/2023   CHOL 172 08/25/2023   TRIG 244 (H) 08/25/2023   HDL 27 (L) 08/25/2023   LDLCALC 103 (H) 08/25/2023   ALT 42 08/25/2023   AST 29 08/25/2023   NA 140 11/11/2023   K 4.7 11/11/2023   CL 103 11/11/2023   CREATININE 1.19 11/11/2023   BUN 14 11/11/2023   CO2 20 11/11/2023   TSH 2.690 06/05/2021   PSA 0.95 03/08/2009   HGBA1C 5.7 (H) 06/05/2020     Assessment & Plan:  Essential hypertension Assessment & Plan: Stable.  Continue losartan .  Orders: -  CMP14+EGFR -     Microalbumin / creatinine urine ratio -     Losartan  Potassium; Take 1 tablet (50 mg total) by mouth daily.  Dispense: 90 tablet; Refill:  3  Mixed hyperlipidemia Assessment & Plan: Not at goal.  Checking lipid panel today. Current ASCVD rescore is 10.2%.  Orders: -     Lipid panel  Screening for deficiency anemia -     CBC  Screening PSA (prostate specific antigen) -     PSA    Follow-up:  Annually  Jacqulyn Ahle DO [facility] "

## 2024-07-24 ENCOUNTER — Other Ambulatory Visit (HOSPITAL_BASED_OUTPATIENT_CLINIC_OR_DEPARTMENT_OTHER): Payer: Self-pay

## 2025-07-09 ENCOUNTER — Encounter: Admitting: Family Medicine
